# Patient Record
Sex: Male | Born: 1975
Health system: Southern US, Community
[De-identification: ages and names within clinical notes are randomized; demographics above are authoritative.]

## PROBLEM LIST (undated history)

## (undated) DIAGNOSIS — G4730 Sleep apnea, unspecified: Secondary | ICD-10-CM

## (undated) DIAGNOSIS — E119 Type 2 diabetes mellitus without complications: Secondary | ICD-10-CM

## (undated) DIAGNOSIS — J449 Chronic obstructive pulmonary disease, unspecified: Secondary | ICD-10-CM

## (undated) DIAGNOSIS — F17210 Nicotine dependence, cigarettes, uncomplicated: Secondary | ICD-10-CM

## (undated) DIAGNOSIS — I10 Essential (primary) hypertension: Secondary | ICD-10-CM

## (undated) DIAGNOSIS — E7800 Pure hypercholesterolemia, unspecified: Secondary | ICD-10-CM

---

## 2010-07-18 DIAGNOSIS — E119 Type 2 diabetes mellitus without complications: Secondary | ICD-10-CM

## 2010-07-18 HISTORY — DX: Type 2 diabetes mellitus without complications: E11.9

## 2014-05-06 DIAGNOSIS — K219 Gastro-esophageal reflux disease without esophagitis: Secondary | ICD-10-CM | POA: Insufficient documentation

## 2014-05-06 DIAGNOSIS — R0681 Apnea, not elsewhere classified: Secondary | ICD-10-CM

## 2014-05-06 DIAGNOSIS — G47419 Narcolepsy without cataplexy: Secondary | ICD-10-CM | POA: Insufficient documentation

## 2014-05-06 DIAGNOSIS — J309 Allergic rhinitis, unspecified: Secondary | ICD-10-CM | POA: Insufficient documentation

## 2014-10-23 DIAGNOSIS — G4733 Obstructive sleep apnea (adult) (pediatric): Secondary | ICD-10-CM | POA: Insufficient documentation

## 2014-12-29 ENCOUNTER — Encounter (HOSPITAL_COMMUNITY): Payer: Self-pay | Admitting: Emergency Medicine

## 2014-12-29 ENCOUNTER — Emergency Department (HOSPITAL_COMMUNITY)
Admission: EM | Admit: 2014-12-29 | Discharge: 2014-12-30 | Disposition: A | Discharge status: Home / Self Care | Payer: Self-pay | Attending: Emergency Medicine | Admitting: Emergency Medicine

## 2014-12-29 DIAGNOSIS — Z720 Tobacco use: Secondary | ICD-10-CM | POA: Insufficient documentation

## 2014-12-29 DIAGNOSIS — I10 Essential (primary) hypertension: Secondary | ICD-10-CM | POA: Insufficient documentation

## 2014-12-29 DIAGNOSIS — Z880 Allergy status to penicillin: Secondary | ICD-10-CM | POA: Insufficient documentation

## 2014-12-29 DIAGNOSIS — J449 Chronic obstructive pulmonary disease, unspecified: Secondary | ICD-10-CM | POA: Insufficient documentation

## 2014-12-29 DIAGNOSIS — E119 Type 2 diabetes mellitus without complications: Secondary | ICD-10-CM | POA: Insufficient documentation

## 2014-12-29 DIAGNOSIS — R252 Cramp and spasm: Secondary | ICD-10-CM | POA: Insufficient documentation

## 2014-12-29 DIAGNOSIS — Z8669 Personal history of other diseases of the nervous system and sense organs: Secondary | ICD-10-CM | POA: Insufficient documentation

## 2014-12-29 HISTORY — DX: Nicotine dependence, cigarettes, uncomplicated: F17.210

## 2014-12-29 HISTORY — DX: Type 2 diabetes mellitus without complications: E11.9

## 2014-12-29 HISTORY — DX: Essential (primary) hypertension: I10

## 2014-12-29 HISTORY — DX: Chronic obstructive pulmonary disease, unspecified: J44.9

## 2014-12-29 HISTORY — DX: Sleep apnea, unspecified: G47.30

## 2014-12-29 HISTORY — DX: Pure hypercholesterolemia, unspecified: E78.00

## 2014-12-29 NOTE — ED Notes (Signed)
Pt. reports pain at right lower calf onset 2 days ago , denies injury /ambulatory , respirations unlabored .

## 2014-12-29 NOTE — ED Provider Notes (Signed)
CSN: [id number]     Arrival date & time 12/29/14  2309 History  This chart was scribed for Everlene Farrier, PA-C, working with Tomasita Crumble, MD by Elon Spanner, ED Scribe. This patient was seen in room TR07C/TR07C and the patient's care was started at 11:52 PM.   Chief Complaint  Patient presents with  . Leg Pain   The history is provided by the patient. No language interpreter was used.   HPI Comments: Steven [name] is a 39 y.o. male with a history of DM who presents to the Emergency Department complaining of 6/10 right lower calf pain described as cramping onset 2 days ago.  He also reports intermittent tingling in the right toes for 1 week.  He reports increased exercise over the past several days when his pain began. He reports normally doing very little exercise.  ASA taken yesterday afforded no relief.  He denies recent surgery, prolonged immobilization, travel as well as a personal or family history of DVT/PE, or blood clotting disorders.  He takes metformin for his DM and his sugars have been relatively well-controlled recently - measured at 160 yesterday.  Patient is a current smoker.  He denies injury leg swelling, numbness, weakness, back pain, fever, chills, CP, SOB, palpitations, arthralgic popping or clicking sounds.    Past Medical History  Diagnosis Date  . COPD (chronic obstructive pulmonary disease)   . Diabetes mellitus without complication   . Hypertension   . Hypercholesterolemia   . Sleep apnea   . Cigarette smoker    History reviewed. No pertinent past surgical history. No family history on file. History  Substance Use Topics  . Smoking status: Current Every Day Smoker  . Smokeless tobacco: Not on file  . Alcohol Use: Yes    Review of Systems  Constitutional: Negative for fever and chills.  Respiratory: Negative for cough, chest tightness and shortness of breath.   Cardiovascular: Negative for chest pain and palpitations.  Musculoskeletal: Positive for  myalgias. Negative for back pain and neck pain.  Skin: Negative for rash and wound.  Neurological: Negative for weakness and numbness.      Allergies  Penicillins  Home Medications   Prior to Admission medications   Not on File   BP 133/86 mmHg  Pulse 80  Temp(Src) 98.1 F (36.7 C) (Oral)  Resp 20  Ht 6' (1.829 m)  Wt 285 lb 8 oz (129.502 kg)  BMI 38.71 kg/m2  SpO2 92% Physical Exam  Constitutional: He is oriented to person, place, and time. He appears well-developed and well-nourished. No distress.   Nontoxic appearing.  HENT:  Head: Normocephalic and atraumatic.  Eyes: Conjunctivae are normal. Pupils are equal, round, and reactive to light. Right eye exhibits no discharge. Left eye exhibits no discharge.  Neck: Normal range of motion. Neck supple. No JVD present.  Cardiovascular: Normal rate, regular rhythm, normal heart sounds and intact distal pulses.   Bilateral radial, posterior tibialis and dorsalis pedis pulses are intact.    Pulmonary/Chest: Effort normal and breath sounds normal. No respiratory distress. He has no wheezes. He has no rales.  Abdominal: Soft. There is no tenderness.  Musculoskeletal: Normal range of motion. He exhibits tenderness. He exhibits no edema.   There is no lower extremity edema or erythema bilaterally. The patient has mild tenderness to his right posterior lower leg beneath his gastrocnemius muscle. Negative thompson test. No achilles tendon rupture.  Patient has 5 out of 5 strength in his bilateral lower extremities. The patient  is able ambulate without difficulty or assistance.  Lymphadenopathy:    He has no cervical adenopathy.  Neurological: He is alert and oriented to person, place, and time. He has normal reflexes. He displays normal reflexes. Coordination normal.  Bilateral patellar DTR's intact. Sensation is intact in BLE.   Skin: Skin is warm and dry. No rash noted. He is not diaphoretic. No erythema. No pallor.  Psychiatric: He  has a normal mood and affect. His behavior is normal.  Nursing note and vitals reviewed.   ED Course  Procedures (including critical care time)  DIAGNOSTIC STUDIES: Oxygen Saturation is 92% on RA, low by my interpretation.    COORDINATION OF CARE:  11:00 PM Patient advised to f/u with [facility].  He was informed of return precautions and acknowledges and agrees with plan.    Labs Review Labs Reviewed  CBG MONITORING, ED - Abnormal; Notable for the following:    Glucose-Capillary 105 (*)    All other components within normal limits    Imaging Review No results found.   EKG Interpretation None      Filed Vitals:   12/29/14 2326 12/29/14 2352  BP: 133/86   Pulse: 80   Temp: 98.1 F (36.7 C)   TempSrc: Oral   Resp: 20   Height: 6' (1.829 m)   Weight: 280 lb (127.007 kg) 285 lb 8 oz (129.502 kg)  SpO2: 92%      MDM  Meds given in ED:  Medications - No data to display  There are no discharge medications for this patient.   Final diagnoses:  Cramps of right lower extremity    this is a 39 year old male with a history of diabetes who presents to the emergency department complaining of right lower leg pain for the past 2 days. Patient reports he is started exercising very recently and then began having pain to the posterior aspect of his right leg beneath his calf muscle. He reports his pain feels like a cramp. I think for treatment today. Reports his blood sugars have been controlled at home.  On exam the patient is afebrile and nontoxic appearing. There is no lower extremity edema or erythema. Patient has mild tenderness to the posterior aspect of his right lower leg beneath his gastrocnemius.  Negative Thompson's test. Patient's blood sugar was checked in the ER which was 105. I believe this patient's pain is due to his increase in exercise recently. Not concerned for DVT. He denies other cramping in the body. Education about exercising and stretching  provided. I advised the patient to follow-up with their primary care provider this week. I advised the patient to return to the emergency department with new or worsening symptoms or new concerns. The patient verbalized understanding and agreement with plan.    I personally performed the services described in this documentation, which was scribed in my presence. The recorded information has been reviewed and is accurate.     Everlene Farrier, PA-C 12/30/14 1610  Tomasita Crumble, MD 12/30/14 [phone]

## 2014-12-30 LAB — CBG MONITORING, ED: Glucose-Capillary: 105 mg/dL — ABNORMAL HIGH (ref 65–99)

## 2014-12-30 NOTE — Discharge Instructions (Signed)
Leg Cramps Leg cramps that occur during exercise can be caused by poor circulation or dehydration. However, muscle cramps that occur at rest or during the night are usually not due to any serious medical problem. Heat cramps may cause muscle spasms during hot weather.  CAUSES There is no clear cause for muscle cramps. However, dehydration may be a factor for those who do not drink enough fluids and those who exercise in the heat. Imbalances in the level of sodium, potassium, calcium or magnesium in the muscle tissue may also be a factor. Some medications, such as water pills (diuretics), may cause loss of chemicals that the body needs (like sodium and potassium) and cause muscle cramps. TREATMENT   Make sure your diet has enough fluids and essential minerals for the muscle to work normally.  Avoid strenuous exercise for several days if you have been having frequent leg cramps.  Stretch and massage the cramped muscle for several minutes.  Some medicines may be helpful in some patients with night cramps. Only take over-the-counter or prescription medicines as directed by your caregiver. SEEK IMMEDIATE MEDICAL CARE IF:   Your leg cramps become worse.  Your foot becomes cold, numb, or blue. Document Released: 08/11/2004 Document Revised: 09/26/2011 Document Reviewed: 07/29/2008 [facility] Patient Information 2015 [place], [place]. This information is not intended to replace advice given to you by your health care provider. Make sure you discuss any questions you have with your health care provider.  Muscle Cramps and Spasms Muscle cramps and spasms occur when a muscle or muscles tighten and you have no control over this tightening (involuntary muscle contraction). They are a common problem and can develop in any muscle. The most common place is in the calf muscles of the leg. Both muscle cramps and muscle spasms are involuntary muscle contractions, but they also have differences:   Muscle cramps are  sporadic and painful. They may last a few seconds to a quarter of an hour. Muscle cramps are often more forceful and last longer than muscle spasms.  Muscle spasms may or may not be painful. They may also last just a few seconds or much longer. CAUSES  It is uncommon for cramps or spasms to be due to a serious underlying problem. In many cases, the cause of cramps or spasms is unknown. Some common causes are:   Overexertion.   Overuse from repetitive motions (doing the same thing over and over).   Remaining in a certain position for a long period of time.   Improper preparation, form, or technique while performing a sport or activity.   Dehydration.   Injury.   Side effects of some medicines.   Abnormally low levels of the salts and ions in your blood (electrolytes), especially potassium and calcium. This could happen if you are taking water pills (diuretics) or you are pregnant.  Some underlying medical problems can make it more likely to develop cramps or spasms. These include, but are not limited to:   Diabetes.   Parkinson disease.   Hormone disorders, such as thyroid problems.   Alcohol abuse.   Diseases specific to muscles, joints, and bones.   Blood vessel disease where not enough blood is getting to the muscles.  HOME CARE INSTRUCTIONS   Stay well hydrated. Drink enough water and fluids to keep your urine clear or pale yellow.  It may be helpful to massage, stretch, and relax the affected muscle.  For tight or tense muscles, use a warm towel, heating pad, or hot  shower water directed to the affected area.  If you are sore or have pain after a cramp or spasm, applying ice to the affected area may relieve discomfort.  Put ice in a plastic bag.  Place a towel between your skin and the bag.  Leave the ice on for 15-20 minutes, 03-04 times a day.  Medicines used to treat a known cause of cramps or spasms may help reduce their frequency or severity.  Only take over-the-counter or prescription medicines as directed by your caregiver. SEEK MEDICAL CARE IF:  Your cramps or spasms get more severe, more frequent, or do not improve over time.  MAKE SURE YOU:   Understand these instructions.  Will watch your condition.  Will get help right away if you are not doing well or get worse. Document Released: 12/24/2001 Document Revised: 10/29/2012 Document Reviewed: 06/20/2012 [facility] Patient Information 2015 [place], [place]. This information is not intended to replace advice given to you by your health care provider. Make sure you discuss any questions you have with your health care provider.

## 2014-12-30 NOTE — ED Notes (Signed)
CBG 105 

## 2014-12-30 NOTE — ED Notes (Signed)
Pt verbalizes understanding of d/c instructions and denies any further needs at this time. 

## 2015-02-16 ENCOUNTER — Encounter (HOSPITAL_COMMUNITY): Payer: Self-pay | Admitting: *Deleted

## 2015-02-16 ENCOUNTER — Emergency Department (INDEPENDENT_AMBULATORY_CARE_PROVIDER_SITE_OTHER)
Admission: EM | Admit: 2015-02-16 | Discharge: 2015-02-16 | Disposition: A | Discharge status: Home / Self Care | Payer: Self-pay | Source: Home / Self Care | Attending: Family Medicine | Admitting: Family Medicine

## 2015-02-16 ENCOUNTER — Ambulatory Visit: Payer: Self-pay | Attending: Internal Medicine

## 2015-02-16 DIAGNOSIS — E669 Obesity, unspecified: Secondary | ICD-10-CM

## 2015-02-16 DIAGNOSIS — E119 Type 2 diabetes mellitus without complications: Secondary | ICD-10-CM

## 2015-02-16 DIAGNOSIS — E1169 Type 2 diabetes mellitus with other specified complication: Secondary | ICD-10-CM

## 2015-02-16 MED ORDER — OMEPRAZOLE 40 MG PO CPDR: 40.0000 mg | DELAYED_RELEASE_CAPSULE | Freq: Every day | ORAL | Status: DC

## 2015-02-16 MED ORDER — ALBUTEROL SULFATE HFA 108 (90 BASE) MCG/ACT IN AERS: 2.0000 | INHALATION_SPRAY | Freq: Four times a day (QID) | RESPIRATORY_TRACT | Status: AC | PRN

## 2015-02-16 MED ORDER — LOSARTAN POTASSIUM 50 MG PO TABS
50.0000 mg | ORAL_TABLET | Freq: Every day | ORAL | Status: DC
Start: 2015-02-16 — End: 2015-04-22

## 2015-02-16 NOTE — Discharge Instructions (Signed)
See your doctor as planned. °

## 2015-02-16 NOTE — ED Notes (Signed)
Pt  Reports  He  Recently  Moved  To  Area   He  Has  No   Provider     He  Is  Out of his  meds  And  Needs  Refill       For  His  Blood pressure    meds       And diabetic  meds     He  Is  Trying to  Get  His  Care  Lined  At  The  [facility]

## 2015-02-16 NOTE — ED Provider Notes (Signed)
CSN: [id number]     Arrival date & time 02/16/15  1522 History   First MD Initiated Contact with Patient 02/16/15 1705     Chief Complaint  Patient presents with  . Medication Refill   (Consider location/radiation/quality/duration/timing/severity/associated sxs/prior Treatment) Patient is a 39 y.o. male presenting with heartburn. The history is provided by the patient.  Heartburn This is a chronic problem. Episode onset: out of meds for11mo. The problem has not changed since onset.Pertinent negatives include no chest pain and no abdominal pain.    Past Medical History  Diagnosis Date  . COPD (chronic obstructive pulmonary disease)   . Diabetes mellitus without complication   . Hypertension   . Hypercholesterolemia   . Sleep apnea   . Cigarette smoker    History reviewed. No pertinent past surgical history. History reviewed. No pertinent family history. History  Substance Use Topics  . Smoking status: Current Every Day Smoker  . Smokeless tobacco: Not on file  . Alcohol Use: Yes    Review of Systems  Constitutional: Negative.   Respiratory: Negative.   Cardiovascular: Negative.  Negative for chest pain.  Gastrointestinal: Positive for heartburn. Negative for nausea, vomiting and abdominal pain.    Allergies  Penicillins  Home Medications   Prior to Admission medications   Medication Sig Start Date End Date Taking? Authorizing Provider  amLODipine (NORVASC) 5 MG tablet Take 5 mg by mouth daily.   Yes Historical Provider, MD  furosemide (LASIX) 40 MG tablet Take 40 mg by mouth daily.   Yes Historical Provider, MD  rosuvastatin (CRESTOR) 40 MG tablet Take 40 mg by mouth daily.   Yes Historical Provider, MD  sitaGLIPtin-metformin (JANUMET) 50-1000 MG per tablet Take 1 tablet by mouth 2 (two) times daily with a meal.   Yes Historical Provider, MD  albuterol (PROVENTIL HFA;VENTOLIN HFA) 108 (90 BASE) MCG/ACT inhaler Inhale 2 puffs into the lungs every 6 (six) hours as needed  for wheezing or shortness of breath. 02/16/15   Linna Hoff, MD  losartan (COZAAR) 50 MG tablet Take 1 tablet (50 mg total) by mouth daily. 02/16/15   Linna Hoff, MD  omeprazole (PRILOSEC) 40 MG capsule Take 1 capsule (40 mg total) by mouth daily. 02/16/15   Linna Hoff, MD   BP 101/64 mmHg  Pulse 64  Temp(Src) 98.1 F (36.7 C) (Oral)  Resp 16  SpO2 97% Physical Exam  Constitutional: He is oriented to person, place, and time. He appears well-developed and well-nourished.  Cardiovascular: Normal heart sounds.   Pulmonary/Chest: Breath sounds normal.  Abdominal: Soft. Bowel sounds are normal.  Neurological: He is alert and oriented to person, place, and time.  Skin: Skin is warm and dry.  Nursing note and vitals reviewed.   ED Course  Procedures (including critical care time) Labs Review Labs Reviewed - No data to display  Imaging Review No results found.   MDM   1. Diabetes mellitus type 2 in obese        Linna Hoff, MD 02/16/15 1810

## 2015-03-01 ENCOUNTER — Emergency Department (HOSPITAL_COMMUNITY)
Admission: EM | Admit: 2015-03-01 | Discharge: 2015-03-01 | Disposition: A | Discharge status: Home / Self Care | Payer: Self-pay | Attending: Emergency Medicine | Admitting: Emergency Medicine

## 2015-03-01 ENCOUNTER — Emergency Department (HOSPITAL_COMMUNITY): Payer: Self-pay

## 2015-03-01 ENCOUNTER — Encounter (HOSPITAL_COMMUNITY): Payer: Self-pay | Admitting: *Deleted

## 2015-03-01 DIAGNOSIS — R0789 Other chest pain: Secondary | ICD-10-CM | POA: Insufficient documentation

## 2015-03-01 DIAGNOSIS — E119 Type 2 diabetes mellitus without complications: Secondary | ICD-10-CM | POA: Insufficient documentation

## 2015-03-01 DIAGNOSIS — E780 Pure hypercholesterolemia: Secondary | ICD-10-CM | POA: Insufficient documentation

## 2015-03-01 DIAGNOSIS — I10 Essential (primary) hypertension: Secondary | ICD-10-CM | POA: Insufficient documentation

## 2015-03-01 DIAGNOSIS — Z720 Tobacco use: Secondary | ICD-10-CM | POA: Insufficient documentation

## 2015-03-01 DIAGNOSIS — Z880 Allergy status to penicillin: Secondary | ICD-10-CM | POA: Insufficient documentation

## 2015-03-01 DIAGNOSIS — J449 Chronic obstructive pulmonary disease, unspecified: Secondary | ICD-10-CM | POA: Insufficient documentation

## 2015-03-01 DIAGNOSIS — Z79899 Other long term (current) drug therapy: Secondary | ICD-10-CM | POA: Insufficient documentation

## 2015-03-01 DIAGNOSIS — Z8669 Personal history of other diseases of the nervous system and sense organs: Secondary | ICD-10-CM | POA: Insufficient documentation

## 2015-03-01 LAB — CBC
HEMATOCRIT: 44.4 % (ref 39.0–52.0)
Hemoglobin: 14.6 g/dL (ref 13.0–17.0)
MCH: 28.7 pg (ref 26.0–34.0)
MCHC: 32.9 g/dL (ref 30.0–36.0)
MCV: 87.2 fL (ref 78.0–100.0)
PLATELETS: 189 10*3/uL (ref 150–400)
RBC: 5.09 MIL/uL (ref 4.22–5.81)
RDW: 14.6 % (ref 11.5–15.5)
WBC: 6.9 10*3/uL (ref 4.0–10.5)

## 2015-03-01 LAB — I-STAT TROPONIN, ED: Troponin i, poc: 0 ng/mL (ref 0.00–0.08)

## 2015-03-01 LAB — BASIC METABOLIC PANEL
ANION GAP: 8 (ref 5–15)
BUN: 9 mg/dL (ref 6–20)
CO2: 24 mmol/L (ref 22–32)
Calcium: 9.2 mg/dL (ref 8.9–10.3)
Chloride: 106 mmol/L (ref 101–111)
Creatinine, Ser: 1.07 mg/dL (ref 0.61–1.24)
GFR calc Af Amer: >60 mL/min (ref >60)
GFR calc non Af Amer: >60 mL/min (ref >60)
GLUCOSE: 100 mg/dL — AB (ref 65–99)
POTASSIUM: 4.1 mmol/L (ref 3.5–5.1)
Sodium: 138 mmol/L (ref 135–145)

## 2015-03-01 NOTE — ED Provider Notes (Signed)
CSN: [id number]     Arrival date & time 03/01/15  1150 History   First MD Initiated Contact with Patient 03/01/15 1443     Chief Complaint  Patient presents with  . Chest Pain   HPI   39 year old male presents today with right anterior chest wall pain. Patient reports proximally 3-4 days ago he was lifting a heavy trash can with his right arm and felt a sharp pain in his right anterior chest. He notes since that time he's had sharp pain with movements of his right arm and deep breaths. Patient reports he tried taking Aleve once this did not improve his symptoms. Patient denies any prior history of trauma to the chest. He denies any loss of sensation strength or function in the right extremity. Patient reports he smokes, no history DVT, PE, denies shortness of breath, history of malignancy. No recent prolonged immobilization, trauma,  or surgeries.  Past Medical History  Diagnosis Date  . COPD (chronic obstructive pulmonary disease)   . Diabetes mellitus without complication   . Hypertension   . Hypercholesterolemia   . Sleep apnea   . Cigarette smoker    History reviewed. No pertinent past surgical history. History reviewed. No pertinent family history. Social History  Substance Use Topics  . Smoking status: Current Every Day Smoker  . Smokeless tobacco: None  . Alcohol Use: Yes    Review of Systems  All other systems reviewed and are negative.   Allergies  Penicillins  Home Medications   Prior to Admission medications   Medication Sig Start Date End Date Taking? Authorizing Provider  albuterol (PROVENTIL HFA;VENTOLIN HFA) 108 (90 BASE) MCG/ACT inhaler Inhale 2 puffs into the lungs every 6 (six) hours as needed for wheezing or shortness of breath. 02/16/15   Linna Hoff, MD  amLODipine (NORVASC) 5 MG tablet Take 5 mg by mouth daily.    Historical Provider, MD  furosemide (LASIX) 40 MG tablet Take 40 mg by mouth daily.    Historical Provider, MD  losartan (COZAAR) 50 MG  tablet Take 1 tablet (50 mg total) by mouth daily. 02/16/15   Linna Hoff, MD  omeprazole (PRILOSEC) 40 MG capsule Take 1 capsule (40 mg total) by mouth daily. 02/16/15   Linna Hoff, MD  rosuvastatin (CRESTOR) 40 MG tablet Take 40 mg by mouth daily.    Historical Provider, MD  sitaGLIPtin-metformin (JANUMET) 50-1000 MG per tablet Take 1 tablet by mouth 2 (two) times daily with a meal.    Historical Provider, MD   BP 143/98 mmHg  Pulse 57  Temp(Src) 98.1 F (36.7 C) (Oral)  Resp 18  Ht  (1.854 m)  Wt 280 lb (127.007 kg)  BMI 36.95 kg/m2  SpO2 95%   Physical Exam  Constitutional: He is oriented to person, place, and time. He appears well-developed and well-nourished.  HENT:  Head: Normocephalic and atraumatic.  Eyes: Conjunctivae are normal. Pupils are equal, round, and reactive to light. Right eye exhibits no discharge. Left eye exhibits no discharge. No scleral icterus.  Neck: Normal range of motion. No JVD present. No tracheal deviation present.  Cardiovascular: Normal rate, regular rhythm, normal heart sounds and intact distal pulses.  Exam reveals no gallop and no friction rub.   No murmur heard. Pulmonary/Chest: Effort normal and breath sounds normal. No stridor. No respiratory distress. He has no wheezes. He has no rales. He exhibits no tenderness.  Musculoskeletal: He exhibits no edema or tenderness.  Tenderness to palpation of  right anterior chest wall, no signs of trauma, obvious deformities. No signs of infection.  Neurological: He is alert and oriented to person, place, and time. Coordination normal.  Skin: Skin is warm and dry.  Psychiatric: He has a normal mood and affect. His behavior is normal. Judgment and thought content normal.  Nursing note and vitals reviewed.   ED Course  Procedures (including critical care time) Labs Review Labs Reviewed  BASIC METABOLIC PANEL - Abnormal; Notable for the following:    Glucose, Bld 100 (*)    All other components within  normal limits  CBC  I-STAT TROPOININ, ED    Imaging Review Dg Chest 2 View  03/01/2015   CLINICAL DATA:  39 year old male with history of right-sided chest pain for the past 4 days.  EXAM: CHEST  2 VIEW  COMPARISON:  No priors.  FINDINGS: Azygos lobe (normal anatomical variant) incidentally noted. Lung volumes are normal. No consolidative airspace disease. No pleural effusions. No pneumothorax. No pulmonary nodule or mass noted. Pulmonary vasculature and the cardiomediastinal silhouette are within normal limits.  IMPRESSION: No radiographic evidence of acute cardiopulmonary disease.   Electronically Signed   By: Trudie Reed M.D.   On: 03/01/2015 13:29   I, Estrellita Lasky Todd, personally reviewed and evaluated these images and lab results as part of my medical decision-making.   EKG Interpretation None      MDM   Final diagnoses:  Chest wall pain    Labs: I-STAT troponin, BMP, CBC - no significant findings  Imaging: DG chest 2 view - no significant findings  Consults:   Therapeutics:  Discharge Meds:   Assessment/Plan: 39 year old male with right anterior chest wall pain. Pain started after a specific event, has continued. Patient stated to palpation of the anterior chest wall, per negative. Today's presentation likely represents muscular pain. Patient has no symptoms or deficits in his right extremity or surrounding tissue. Patient instructed to use ibuprofen 600 mg for the next 5 days, followed with primary care evaluation thereafter. Given strict return precautions, verbalized understanding and agreed to today's plan no further questions or concerns at time of discharge.        Eyvonne Mechanic, PA-C 03/01/15 1516  Eyvonne Mechanic, PA-C 03/01/15 1539  Margarita Grizzle, MD 03/01/15 [phone]

## 2015-03-01 NOTE — ED Notes (Signed)
Pt returns from xray

## 2015-03-01 NOTE — ED Notes (Signed)
Pt reports right side chest pain x 4 days. Pain increases with movement, cough, breathing. Reports swelling to right breast area and palpates a "knot" there. No acute distress noted at triage.

## 2015-03-01 NOTE — Discharge Instructions (Signed)
Chest Wall Pain Chest wall pain is pain in or around the bones and muscles of your chest. It may take up to 6 weeks to get better. It may take longer if you must stay physically active in your work and activities.  CAUSES  Chest wall pain may happen on its own. However, it may be caused by:  A viral illness like the flu.  Injury.  Coughing.  Exercise.  Arthritis.  Fibromyalgia.  Shingles. HOME CARE INSTRUCTIONS   Avoid overtiring physical activity. Try not to strain or perform activities that cause pain. This includes any activities using your chest or your abdominal and side muscles, especially if heavy weights are used.  Put ice on the sore area.  Put ice in a plastic bag.  Place a towel between your skin and the bag.  Leave the ice on for 15-20 minutes per hour while awake for the first 2 days.  Only take over-the-counter or prescription medicines for pain, discomfort, or fever as directed by your caregiver. SEEK IMMEDIATE MEDICAL CARE IF:   Your pain increases, or you are very uncomfortable.  You have a fever.  Your chest pain becomes worse.  You have new, unexplained symptoms.  You have nausea or vomiting.  You feel sweaty or lightheaded.  You have a cough with phlegm (sputum), or you cough up blood. MAKE SURE YOU:   Understand these instructions.  Will watch your condition.  Will get help right away if you are not doing well or get worse. Document Released: 07/04/2005 Document Revised: 09/26/2011 Document Reviewed: 02/28/2011 [facility] Patient Information 2015 [place], [place]. This information is not intended to replace advice given to you by your health care provider. Make sure you discuss any questions you have with your health care provider.  Please use ibuprofen as needed for pain., Follow-up with [facility] wellness for further evaluation and management.

## 2015-04-03 ENCOUNTER — Emergency Department (HOSPITAL_COMMUNITY)
Admission: EM | Admit: 2015-04-03 | Discharge: 2015-04-03 | Disposition: A | Discharge status: Home / Self Care | Payer: Self-pay | Attending: Emergency Medicine | Admitting: Emergency Medicine

## 2015-04-03 ENCOUNTER — Encounter (HOSPITAL_COMMUNITY): Payer: Self-pay | Admitting: Emergency Medicine

## 2015-04-03 ENCOUNTER — Emergency Department (HOSPITAL_COMMUNITY): Payer: Self-pay

## 2015-04-03 DIAGNOSIS — E119 Type 2 diabetes mellitus without complications: Secondary | ICD-10-CM | POA: Insufficient documentation

## 2015-04-03 DIAGNOSIS — Y998 Other external cause status: Secondary | ICD-10-CM | POA: Insufficient documentation

## 2015-04-03 DIAGNOSIS — E780 Pure hypercholesterolemia: Secondary | ICD-10-CM | POA: Insufficient documentation

## 2015-04-03 DIAGNOSIS — X58XXXA Exposure to other specified factors, initial encounter: Secondary | ICD-10-CM | POA: Insufficient documentation

## 2015-04-03 DIAGNOSIS — J449 Chronic obstructive pulmonary disease, unspecified: Secondary | ICD-10-CM | POA: Insufficient documentation

## 2015-04-03 DIAGNOSIS — Z880 Allergy status to penicillin: Secondary | ICD-10-CM | POA: Insufficient documentation

## 2015-04-03 DIAGNOSIS — S93601A Unspecified sprain of right foot, initial encounter: Secondary | ICD-10-CM | POA: Insufficient documentation

## 2015-04-03 DIAGNOSIS — Y9289 Other specified places as the place of occurrence of the external cause: Secondary | ICD-10-CM | POA: Insufficient documentation

## 2015-04-03 DIAGNOSIS — Y9389 Activity, other specified: Secondary | ICD-10-CM | POA: Insufficient documentation

## 2015-04-03 DIAGNOSIS — Z79899 Other long term (current) drug therapy: Secondary | ICD-10-CM | POA: Insufficient documentation

## 2015-04-03 DIAGNOSIS — I10 Essential (primary) hypertension: Secondary | ICD-10-CM | POA: Insufficient documentation

## 2015-04-03 DIAGNOSIS — Z720 Tobacco use: Secondary | ICD-10-CM | POA: Insufficient documentation

## 2015-04-03 MED ORDER — NAPROXEN 500 MG PO TABS: 500.0000 mg | ORAL_TABLET | Freq: Two times a day (BID) | ORAL | Status: DC

## 2015-04-03 MED ORDER — HYDROCODONE-ACETAMINOPHEN 5-325 MG PO TABS
2.0000 | ORAL_TABLET | Freq: Once | ORAL | Status: AC
  Administered 2015-04-03: 2 via ORAL
  Filled 2015-04-03: qty 2

## 2015-04-03 NOTE — ED Provider Notes (Signed)
CSN: [id number]     Arrival date & time 04/03/15  0220 History   First MD Initiated Contact with Patient 04/03/15 0232     Chief Complaint  Patient presents with  . Ankle Pain     (Consider location/radiation/quality/duration/timing/severity/associated sxs/prior Treatment) Patient is a 39 y.o. male presenting with ankle pain. The history is provided by the patient. No language interpreter was used.  Ankle Pain Location:  Foot Time since incident:  2 days Injury: no   Foot location:  R foot Pain details:    Quality:  Sharp and aching   Radiates to:  Does not radiate   Severity:  Moderate   Onset quality:  Gradual   Duration:  2 days   Timing:  Constant   Progression:  Worsening Chronicity:  New Prior injury to area:  No Relieved by:  Nothing Worsened by:  Bearing weight Ineffective treatments: aspirin. Associated symptoms: no decreased ROM, no fever, no numbness, no swelling and no tingling   Risk factors: obesity     Past Medical History  Diagnosis Date  . COPD (chronic obstructive pulmonary disease)   . Diabetes mellitus without complication   . Hypertension   . Hypercholesterolemia   . Sleep apnea   . Cigarette smoker    History reviewed. No pertinent past surgical history. No family history on file. Social History  Substance Use Topics  . Smoking status: Current Every Day Smoker  . Smokeless tobacco: None  . Alcohol Use: Yes    Review of Systems  Constitutional: Negative for fever.  Musculoskeletal: Positive for myalgias and arthralgias.  Neurological: Negative for weakness and numbness.  All other systems reviewed and are negative.   Allergies  Penicillins  Home Medications   Prior to Admission medications   Medication Sig Start Date End Date Taking? Authorizing Provider  albuterol (PROVENTIL HFA;VENTOLIN HFA) 108 (90 BASE) MCG/ACT inhaler Inhale 2 puffs into the lungs every 6 (six) hours as needed for wheezing or shortness of breath. 02/16/15    Linna Hoff, MD  amLODipine (NORVASC) 5 MG tablet Take 5 mg by mouth daily.    Historical Provider, MD  furosemide (LASIX) 40 MG tablet Take 40 mg by mouth daily.    Historical Provider, MD  losartan (COZAAR) 50 MG tablet Take 1 tablet (50 mg total) by mouth daily. 02/16/15   Linna Hoff, MD  naproxen (NAPROSYN) 500 MG tablet Take 1 tablet (500 mg total) by mouth 2 (two) times daily. 04/03/15   Antony Madura, PA-C  omeprazole (PRILOSEC) 40 MG capsule Take 1 capsule (40 mg total) by mouth daily. 02/16/15   Linna Hoff, MD  rosuvastatin (CRESTOR) 40 MG tablet Take 40 mg by mouth daily.    Historical Provider, MD  sitaGLIPtin-metformin (JANUMET) 50-1000 MG per tablet Take 1 tablet by mouth 2 (two) times daily with a meal.    Historical Provider, MD   BP 140/69 mmHg  Pulse 67  Temp(Src) 98.3 F (36.8 C) (Oral)  Resp 16  Ht 6' (1.829 m)  SpO2 96%   Physical Exam  Constitutional: He is oriented to person, place, and time. He appears well-developed and well-nourished. No distress.  Nontoxic/nonseptic appearing  HENT:  Head: Normocephalic and atraumatic.  Eyes: Conjunctivae and EOM are normal. No scleral icterus.  Neck: Normal range of motion.  Cardiovascular: Normal rate, regular rhythm and intact distal pulses.   DP and PT pulses 2+ in the RLE  Pulmonary/Chest: Effort normal. No respiratory distress.  Respirations even and  unlabored  Musculoskeletal: Normal range of motion.       Right ankle: Normal.       Right foot: There is tenderness and bony tenderness. There is normal range of motion, no swelling, normal capillary refill, no crepitus and no deformity.       Feet:  Neurological: He is alert and oriented to person, place, and time. He exhibits normal muscle tone. Coordination normal.  Sensation to light touch intact. Patient able to wiggle all toes.  Skin: Skin is warm and dry. No rash noted. He is not diaphoretic. No erythema. No pallor.  Psychiatric: He has a normal mood and  affect. His behavior is normal.  Nursing note and vitals reviewed.   ED Course  Procedures (including critical care time) Labs Review Labs Reviewed - No data to display  Imaging Review Dg Foot Complete Right  04/03/2015   CLINICAL DATA:  39 year old male with right foot pain.  EXAM: RIGHT FOOT COMPLETE - 3+ VIEW  COMPARISON:  None.  FINDINGS: There is no evidence of fracture or dislocation. There is no evidence of arthropathy or other focal bone abnormality. Soft tissues are unremarkable.  IMPRESSION: Negative.   Electronically Signed   By: Elgie Collard M.D.   On: 04/03/2015 03:33     I have personally reviewed and evaluated these images and lab results as part of my medical decision-making.   EKG Interpretation None      MDM   Final diagnoses:  Foot sprain, right, initial encounter    39 y/o male presents for atraumatic R foot pain, onset yesterday. Patient neurovascularly intact. No redness, swelling, or heat to touch. Normal ROM. Doubt septic joint or hemarthrosis. No bony changes on xray. Will tx symptoms as foot sprain. ASO given for stability of foot/ankle. RICE and NSAIDs advised. Patient referred to podiatry if pain persists. Return precautions given. Patient agreeable to plan with no unaddressed concerns. Patient discharged in good condition.   Filed Vitals:   04/03/15 0222 04/03/15 0346 04/03/15 0346  BP: 144/72 140/69   Pulse: 88 67   Temp: 98.3 F (36.8 C)    TempSrc: Oral    Resp: 14 16   Height:   6' (1.829 m)  SpO2: 96% 96%      Antony Madura, PA-C 04/03/15 1610  Loren Racer, MD 04/03/15 [phone]

## 2015-04-03 NOTE — Discharge Instructions (Signed)
Foot Sprain The muscles and cord like structures which attach muscle to bone (tendons) that surround the feet are made up of units. A foot sprain can occur at the weakest spot in any of these units. This condition is most often caused by injury to or overuse of the foot, as from playing contact sports, or aggravating a previous injury, or from poor conditioning, or obesity. SYMPTOMS  Pain with movement of the foot.  Tenderness and swelling at the injury site.  Loss of strength is present in moderate or severe sprains. THE THREE GRADES OR SEVERITY OF FOOT SPRAIN ARE:  Mild (Grade I): Slightly pulled muscle without tearing of muscle or tendon fibers or loss of strength.  Moderate (Grade II): Tearing of fibers in a muscle, tendon, or at the attachment to bone, with small decrease in strength.  Severe (Grade III): Rupture of the muscle-tendon-bone attachment, with separation of fibers. Severe sprain requires surgical repair. Often repeating (chronic) sprains are caused by overuse. Sudden (acute) sprains are caused by direct injury or over-use. DIAGNOSIS  Diagnosis of this condition is usually by your own observation. If problems continue, a caregiver may be required for further evaluation and treatment. X-rays may be required to make sure there are not breaks in the bones (fractures) present. Continued problems may require physical therapy for treatment. PREVENTION  Use strength and conditioning exercises appropriate for your sport.  Warm up properly prior to working out.  Use athletic shoes that are made for the sport you are participating in.  Allow adequate time for healing. Early return to activities makes repeat injury more likely, and can lead to an unstable arthritic foot that can result in prolonged disability. Mild sprains generally heal in 3 to 10 days, with moderate and severe sprains taking 2 to 10 weeks. Your caregiver can help you determine the proper time required for  healing. HOME CARE INSTRUCTIONS   Apply ice to the injury for 15-20 minutes, 03-04 times per day. Put the ice in a plastic bag and place a towel between the bag of ice and your skin.  An elastic wrap (like an Ace bandage) may be used to keep swelling down.  Keep foot above the level of the heart, or at least raised on a footstool, when swelling and pain are present.  Try to avoid use other than gentle range of motion while the foot is painful. Do not resume use until instructed by your caregiver. Then begin use gradually, not increasing use to the point of pain. If pain does develop, decrease use and continue the above measures, gradually increasing activities that do not cause discomfort, until you gradually achieve normal use.  Use crutches if and as instructed, and for the length of time instructed.  Keep injured foot and ankle wrapped between treatments.  Massage foot and ankle for comfort and to keep swelling down. Massage from the toes up towards the knee.  Only take over-the-counter or prescription medicines for pain, discomfort, or fever as directed by your caregiver. SEEK IMMEDIATE MEDICAL CARE IF:   Your pain and swelling increase, or pain is not controlled with medications.  You have loss of feeling in your foot or your foot turns cold or blue.  You develop new, unexplained symptoms, or an increase of the symptoms that brought you to your caregiver. MAKE SURE YOU:   Understand these instructions.  Will watch your condition.  Will get help right away if you are not doing well or get worse. Document Released:   12/24/2001 Document Revised: 09/26/2011 Document Reviewed: 02/21/2008 ExitCare Patient Information 2015 [employer]. This information is not intended to replace advice given to you by your health care provider. Make sure you discuss any questions you have with your health care provider.  RICE: Routine Care for Injuries The routine care of many injuries includes  Rest, Ice, Compression, and Elevation (RICE). HOME CARE INSTRUCTIONS  Rest is needed to allow your body to heal. Routine activities can usually be resumed when comfortable. Injured tendons and bones can take up to 6 weeks to heal. Tendons are the cord-like structures that attach muscle to bone.  Ice following an injury helps keep the swelling down and reduces pain.  Put ice in a plastic bag.  Place a towel between your skin and the bag.  Leave the ice on for 15-20 minutes, 3-4 times a day, or as directed by your health care provider. Do this while awake, for the first 24 to 48 hours. After that, continue as directed by your caregiver.  Compression helps keep swelling down. It also gives support and helps with discomfort. If an elastic bandage has been applied, it should be removed and reapplied every 3 to 4 hours. It should not be applied tightly, but firmly enough to keep swelling down. Watch fingers or toes for swelling, bluish discoloration, coldness, numbness, or excessive pain. If any of these problems occur, remove the bandage and reapply loosely. Contact your caregiver if these problems continue.  Elevation helps reduce swelling and decreases pain. With extremities, such as the arms, hands, legs, and feet, the injured area should be placed near or above the level of the heart, if possible. SEEK IMMEDIATE MEDICAL CARE IF:  You have persistent pain and swelling.  You develop redness, numbness, or unexpected weakness.  Your symptoms are getting worse rather than improving after several days. These symptoms may indicate that further evaluation or further X-rays are needed. Sometimes, X-rays may not show a small broken bone (fracture) until 1 week or 10 days later. Make a follow-up appointment with your caregiver. Ask when your X-ray results will be ready. Make sure you get your X-ray results. Document Released: 10/16/2000 Document Revised: 07/09/2013 Document Reviewed:  12/03/2010 ExitCare Patient Information 2015 [employer]. This information is not intended to replace advice given to you by your health care provider. Make sure you discuss any questions you have with your health care provider.  

## 2015-04-03 NOTE — ED Notes (Signed)
Ortho tech paged  

## 2015-04-03 NOTE — ED Notes (Signed)
Patient is resting comfortably. 

## 2015-04-03 NOTE — ED Notes (Signed)
Pt. reports right ankle and right foot pain onset yesterday , denies injury , ambulatory .

## 2015-04-03 NOTE — ED Notes (Signed)
Kelly, pa-c, at the bedside 

## 2015-04-20 ENCOUNTER — Telehealth: Payer: Self-pay | Admitting: Internal Medicine

## 2015-04-20 ENCOUNTER — Ambulatory Visit: Payer: Self-pay | Admitting: Internal Medicine

## 2015-04-20 NOTE — Telephone Encounter (Signed)
Patient called requesting a med refill on sitaGLIPtin-metformin (JANUMET) 50-1000 MG per tablet, and losartan (COZAAR) 50 MG tablet. Patient does not have a PCP. Please f/u with pt.

## 2015-04-22 ENCOUNTER — Ambulatory Visit: Payer: Self-pay | Attending: Internal Medicine | Admitting: Family Medicine

## 2015-04-22 ENCOUNTER — Encounter: Payer: Self-pay | Admitting: Family Medicine

## 2015-04-22 VITALS — BP 133/84 | HR 73 | Temp 98.1°F | Resp 16 | Ht 73.0 in | Wt 288.0 lb

## 2015-04-22 DIAGNOSIS — B351 Tinea unguium: Secondary | ICD-10-CM | POA: Insufficient documentation

## 2015-04-22 DIAGNOSIS — E7800 Pure hypercholesterolemia, unspecified: Secondary | ICD-10-CM | POA: Insufficient documentation

## 2015-04-22 DIAGNOSIS — Z833 Family history of diabetes mellitus: Secondary | ICD-10-CM | POA: Insufficient documentation

## 2015-04-22 DIAGNOSIS — R809 Proteinuria, unspecified: Secondary | ICD-10-CM

## 2015-04-22 DIAGNOSIS — Z0000 Encounter for general adult medical examination without abnormal findings: Secondary | ICD-10-CM

## 2015-04-22 DIAGNOSIS — E119 Type 2 diabetes mellitus without complications: Secondary | ICD-10-CM | POA: Insufficient documentation

## 2015-04-22 DIAGNOSIS — F17210 Nicotine dependence, cigarettes, uncomplicated: Secondary | ICD-10-CM | POA: Insufficient documentation

## 2015-04-22 DIAGNOSIS — Z720 Tobacco use: Secondary | ICD-10-CM

## 2015-04-22 DIAGNOSIS — E049 Nontoxic goiter, unspecified: Secondary | ICD-10-CM | POA: Insufficient documentation

## 2015-04-22 DIAGNOSIS — E118 Type 2 diabetes mellitus with unspecified complications: Secondary | ICD-10-CM

## 2015-04-22 DIAGNOSIS — E1129 Type 2 diabetes mellitus with other diabetic kidney complication: Secondary | ICD-10-CM

## 2015-04-22 DIAGNOSIS — R0602 Shortness of breath: Secondary | ICD-10-CM | POA: Insufficient documentation

## 2015-04-22 DIAGNOSIS — Z79899 Other long term (current) drug therapy: Secondary | ICD-10-CM | POA: Insufficient documentation

## 2015-04-22 DIAGNOSIS — G4730 Sleep apnea, unspecified: Secondary | ICD-10-CM | POA: Insufficient documentation

## 2015-04-22 DIAGNOSIS — E1169 Type 2 diabetes mellitus with other specified complication: Secondary | ICD-10-CM | POA: Insufficient documentation

## 2015-04-22 DIAGNOSIS — F17200 Nicotine dependence, unspecified, uncomplicated: Secondary | ICD-10-CM | POA: Insufficient documentation

## 2015-04-22 DIAGNOSIS — I10 Essential (primary) hypertension: Secondary | ICD-10-CM | POA: Insufficient documentation

## 2015-04-22 DIAGNOSIS — J449 Chronic obstructive pulmonary disease, unspecified: Secondary | ICD-10-CM | POA: Insufficient documentation

## 2015-04-22 LAB — T3, FREE: T3, Free: 3.6 pg/mL (ref 2.3–4.2)

## 2015-04-22 LAB — COMPLETE METABOLIC PANEL WITH GFR
ALT: 27 U/L (ref 9–46)
AST: 20 U/L (ref 10–40)
Albumin: 4.6 g/dL (ref 3.6–5.1)
Alkaline Phosphatase: 68 U/L (ref 40–115)
BUN: 12 mg/dL (ref 7–25)
CALCIUM: 9.5 mg/dL (ref 8.6–10.3)
CO2: 28 mmol/L (ref 20–31)
Chloride: 107 mmol/L (ref 98–110)
Creat: 1.23 mg/dL (ref 0.60–1.35)
GFR, Est African American: 84 mL/min (ref >=60)
GFR, Est Non African American: 73 mL/min (ref >=60)
Glucose, Bld: 95 mg/dL (ref 65–99)
Potassium: 5 mmol/L (ref 3.5–5.3)
Sodium: 143 mmol/L (ref 135–146)
Total Bilirubin: 0.3 mg/dL (ref 0.2–1.2)
Total Protein: 7.6 g/dL (ref 6.1–8.1)

## 2015-04-22 LAB — GLUCOSE, POCT (MANUAL RESULT ENTRY): POC GLUCOSE: 107 mg/dL — AB (ref 70–99)

## 2015-04-22 LAB — POCT GLYCOSYLATED HEMOGLOBIN (HGB A1C): HEMOGLOBIN A1C: 6.3

## 2015-04-22 LAB — T4, FREE: Free T4: 1.02 ng/dL (ref 0.80–1.80)

## 2015-04-22 LAB — TSH: TSH: 0.527 u[IU]/mL (ref 0.350–4.500)

## 2015-04-22 MED ORDER — SITAGLIPTIN PHOS-METFORMIN HCL 50-1000 MG PO TABS: 1.0000 | ORAL_TABLET | Freq: Two times a day (BID) | ORAL | Status: DC

## 2015-04-22 MED ORDER — VARENICLINE TARTRATE 1 MG PO TABS: 1.0000 mg | ORAL_TABLET | Freq: Two times a day (BID) | ORAL | Status: DC

## 2015-04-22 MED ORDER — LOSARTAN POTASSIUM 50 MG PO TABS: 50.0000 mg | ORAL_TABLET | Freq: Every day | ORAL | Status: DC

## 2015-04-22 MED ORDER — CHLORTHALIDONE 25 MG PO TABS: 25.0000 mg | ORAL_TABLET | Freq: Every day | ORAL | Status: DC

## 2015-04-22 MED ORDER — VARENICLINE TARTRATE 0.5 MG X 11 & 1 MG X 42 PO MISC: ORAL | Status: DC

## 2015-04-22 NOTE — Patient Instructions (Addendum)
Thank you for coming in today. It was a pleasure meeting you. I look forward to being your primary doctor.  Steven [name] was seen today for diabetes, goiter and hypertension.  Diagnoses and all orders for this visit:  Type 2 diabetes mellitus with complication, without long-term current use of insulin (HCC) -     Microalbumin/Creatinine Ratio, Urine -     Glucose (CBG) -     HgB A1c -     sitaGLIPtin-metformin (JANUMET) 50-1000 MG tablet; Take 1 tablet by mouth 2 (two) times daily with a meal.  Essential hypertension -     losartan (COZAAR) 50 MG tablet; Take 1 tablet (50 mg total) by mouth daily. -     chlorthalidone (HYGROTON) 25 MG tablet; Take 1 tablet (25 mg total) by mouth daily.  SOB (shortness of breath) -     Pro b natriuretic peptide  Current smoker -     varenicline (CHANTIX STARTING MONTH PAK) 0.5 MG X 11 & 1 MG X 42 tablet; Taper per packet insert -     varenicline (CHANTIX CONTINUING MONTH PAK) 1 MG tablet; Take 1 tablet (1 mg total) by mouth 2 (two) times daily.  Onychomycosis of multiple toenails with type 2 diabetes mellitus (HCC) -     COMPLETE METABOLIC PANEL WITH GFR  .Smoking cessation support: smoking cessation hotline: 1-800-QUIT-NOW.  Smoking cessation classes are available through [facility]. Call (680)[phone] or visit our website at [email or web address].   STOP lasix and norvasc Add chlorthalidone 25 mg daily for BP control, refilled losartan and januvia   You will be called with lab results  F/u in 4-6 weeks for RN/pharmacy BP check F/u with me in 3 months  Dr. Armen Pickup

## 2015-04-22 NOTE — Assessment & Plan Note (Signed)
A;DM2 Med: compliant P: Continue [place]

## 2015-04-22 NOTE — Assessment & Plan Note (Addendum)
A; patient ready to quit P: chantix  Counseled patient on risk of SI

## 2015-04-22 NOTE — Assessment & Plan Note (Signed)
A; toenail fungus P: CMP Followed by lamisil if CMP normal

## 2015-04-22 NOTE — Assessment & Plan Note (Signed)
A; BP control with prn lasix. Peripheral edema on norvasc P: D.c lasix and norvasc Refilled losartan Start chlorthalidone

## 2015-04-22 NOTE — Progress Notes (Signed)
Subjective:  Patient ID: Steven [name], male    DOB: 1976-02-24  Age: 39 y.o. MRN: [id number]  CC: Diabetes; Goiter; and Hypertension   HPI Steven [name] presents for    1. CHRONIC DIABETES dx in 2012/2013   Disease Monitoring  Blood Sugar Ranges: not checking   Polyuria: no   Visual problems: no   Medication Compliance: yes  Medication Side Effects  Hypoglycemia: no   Preventitive Health Care  Eye Exam: done recently   Foot Exam: done today     2. .CHRONIC HYPERTENSION  Disease Monitoring  Blood pressure range: not checking   Chest pain: yes, L sided chest tightness    Dyspnea: yes, at  Rest, intermittent    Claudication: no   Medication compliance: yes, takes lasix 2-3 times per day  Medication Side Effects  Lightheadedness: no   Urinary frequency: no   Edema: yes   3. Goiter: x 2-3 years. Has hx of benign biopsy. Mom with goiter. No weight changes. Swelling in legs. No anxiety or depressed mood. He has f/u at [facility] in 3 weeks.   4. Current smoker: desire to quit. Denies SI. Would like to try chantix. Has some SOB and CP at rest. L breast CP, tightness. Non-radiating.   Past Medical History  Diagnosis Date  . COPD (chronic obstructive pulmonary disease) (HCC)   . Diabetes mellitus without complication (HCC)   . Hypertension   . Hypercholesterolemia   . Sleep apnea   . Cigarette smoker     History reviewed. No pertinent past surgical history.  Family History  Problem Relation Age of Onset  . Cancer Mother   . Diabetes Father     Social History  Substance Use Topics  . Smoking status: Current Every Day Smoker  . Smokeless tobacco: Not on file  . Alcohol Use: Yes    ROS Review of Systems  Constitutional: Negative for fever, chills, fatigue and unexpected weight change.  HENT: Negative for trouble swallowing.   Eyes: Negative for visual disturbance.  Respiratory: Positive for shortness of breath. Negative for cough.   Cardiovascular:  Positive for chest pain. Negative for palpitations and leg swelling.  Gastrointestinal: Negative for nausea, vomiting, abdominal pain, diarrhea, constipation and blood in stool.  Endocrine: Negative for polydipsia, polyphagia and polyuria.  Musculoskeletal: Negative for myalgias, back pain, arthralgias, gait problem and neck pain.  Skin: Negative for rash.  Allergic/Immunologic: Negative for immunocompromised state.  Hematological: Negative for adenopathy. Does not bruise/bleed easily.  Psychiatric/Behavioral: Negative for suicidal ideas, sleep disturbance and dysphoric mood. The patient is not nervous/anxious.     Objective:   Today's Vitals: BP 133/84 mmHg  Pulse 73  Temp(Src) 98.1 F (36.7 C) (Oral)  Resp 16  Ht  (1.854 m)  Wt 288 lb (130.636 kg)  BMI 38.01 kg/m2  SpO2 98%  Wt Readings from Last 3 Encounters:  04/22/15 288 lb (130.636 kg)  03/01/15 280 lb (127.007 kg)  12/29/14 285 lb 8 oz (129.502 kg)   BP Readings from Last 3 Encounters:  04/03/15 140/69  03/01/15 143/98  02/16/15 101/64    Physical Exam  Constitutional: He appears well-developed and well-nourished. No distress.  HENT:  [name]: Normocephalic and atraumatic.  Mouth/Throat: Oropharynx is clear and moist.  Eyes: Conjunctivae and EOM are normal. Pupils are equal, round, and reactive to light.  Neck: Normal range of motion. Neck supple. Thyromegaly present.  Cardiovascular: Normal rate, regular rhythm, normal heart sounds and intact distal pulses.  Pulmonary/Chest: Effort normal and breath sounds normal.  Musculoskeletal: He exhibits edema (trace LE edema ).  Lymphadenopathy:    He has no cervical adenopathy.  Neurological: He is alert.  Skin: Skin is warm and dry. No rash noted. No erythema.  Toenail thickened and yellow   Psychiatric: He has a normal mood and affect.   Lab Results  Component Value Date   HGBA1C 6.30 04/22/2015   CBG 107  Assessment & Plan:   [place]on was seen today for  diabetes, goiter and hypertension.  Diagnoses and all orders for this visit:  Type 2 diabetes mellitus with complication, without long-term current use of insulin (HCC) -     Microalbumin/Creatinine Ratio, Urine -     Glucose (CBG) -     HgB A1c -     sitaGLIPtin-metformin (JANUMET) 50-1000 MG tablet; Take 1 tablet by mouth 2 (two) times daily with a meal. -     Ambulatory referral to Ophthalmology  Essential hypertension -     losartan (COZAAR) 50 MG tablet; Take 1 tablet (50 mg total) by mouth daily. -     chlorthalidone (HYGROTON) 25 MG tablet; Take 1 tablet (25 mg total) by mouth daily.  SOB (shortness of breath) -     Pro b natriuretic peptide  Current smoker -     varenicline (CHANTIX STARTING MONTH PAK) 0.5 MG X 11 & 1 MG X 42 tablet; Taper per packet insert -     varenicline (CHANTIX CONTINUING MONTH PAK) 1 MG tablet; Take 1 tablet (1 mg total) by mouth 2 (two) times daily.  Onychomycosis of multiple toenails with type 2 diabetes mellitus (HCC) -     COMPLETE METABOLIC PANEL WITH GFR  Goiter -     TSH -     T4, free -     T3, Free  Healthcare maintenance -     Flu Vaccine QUAD 36+ mos IM    Follow-up: No Follow-up on file.    Dessa Phi MD

## 2015-04-22 NOTE — Assessment & Plan Note (Signed)
A: SOB in smoker, HTN, leg swelling. Recent CXR w/o cardiomegaly P: proBNP Smoking cessation

## 2015-04-22 NOTE — Progress Notes (Signed)
Establish Care Hx DM, Sleep apnia HTN Goiter  No pain today  Hx tobacco 1ppday

## 2015-04-23 LAB — MICROALBUMIN / CREATININE URINE RATIO
Creatinine, Urine: 150.2 mg/dL
MICROALB/CREAT RATIO: 192.4 mg/g — AB (ref 0.0–30.0)
Microalb, Ur: 28.9 mg/dL — ABNORMAL HIGH (ref <2.0)

## 2015-04-23 LAB — PRO B NATRIURETIC PEPTIDE: Pro B Natriuretic peptide (BNP): 21.38 pg/mL (ref <126)

## 2015-04-24 ENCOUNTER — Telehealth: Payer: Self-pay | Admitting: *Deleted

## 2015-04-24 DIAGNOSIS — R809 Proteinuria, unspecified: Secondary | ICD-10-CM | POA: Insufficient documentation

## 2015-04-24 DIAGNOSIS — B351 Tinea unguium: Secondary | ICD-10-CM

## 2015-04-24 DIAGNOSIS — E1129 Type 2 diabetes mellitus with other diabetic kidney complication: Secondary | ICD-10-CM | POA: Insufficient documentation

## 2015-04-24 DIAGNOSIS — E1169 Type 2 diabetes mellitus with other specified complication: Secondary | ICD-10-CM

## 2015-04-24 MED ORDER — LOSARTAN POTASSIUM 100 MG PO TABS: 100.0000 mg | ORAL_TABLET | Freq: Every day | ORAL | Status: DC

## 2015-04-24 NOTE — Telephone Encounter (Signed)
-----   Message from Dessa Phi, MD sent at 04/24/2015  9:39 AM EDT ----- All labs normal Elevated urine microalbumin, patient to increase losartan to 100 mg to help protect kidneys Tight blood sugar and BP control

## 2015-04-24 NOTE — Addendum Note (Signed)
Addended by: Dessa Phi on: 04/24/2015 09:42 AM   Modules accepted: Orders, SmartSet

## 2015-04-24 NOTE — Telephone Encounter (Signed)
Date of birth verified by pt Labs results given  Pt advised to increased losartan to 100 mg for protection of kidney  Pt verbalized understanding

## 2015-04-27 MED ORDER — TERBINAFINE HCL 250 MG PO TABS: 250.0000 mg | ORAL_TABLET | Freq: Every day | ORAL | Status: DC

## 2015-04-30 ENCOUNTER — Telehealth: Payer: Self-pay | Admitting: Family Medicine

## 2015-04-30 DIAGNOSIS — E049 Nontoxic goiter, unspecified: Secondary | ICD-10-CM

## 2015-04-30 DIAGNOSIS — R809 Proteinuria, unspecified: Secondary | ICD-10-CM

## 2015-04-30 DIAGNOSIS — E1129 Type 2 diabetes mellitus with other diabetic kidney complication: Secondary | ICD-10-CM

## 2015-04-30 NOTE — Telephone Encounter (Signed)
Pt stated unable to make it to [place] for Endocrinologist appointment due to transportation Requesting endocrinologist referral in [place] Previews PCP referred him.

## 2015-04-30 NOTE — Telephone Encounter (Signed)
Patient called stating that he is suppose to go and see a endocrinologist on 05/01/15, but pt. Stated  that PCP wanted him to do blood work first before he goes to the appt. Pt. Did the bood work on his last visit on 04/22/15. Pt. Would like to know if he should go to the appt. Or cancel the appt. Pt. Would want to see an endocrinologist in [place], he has an appt. In [place]. Please f/u with pt.

## 2015-05-15 ENCOUNTER — Ambulatory Visit (INDEPENDENT_AMBULATORY_CARE_PROVIDER_SITE_OTHER): Payer: Self-pay | Admitting: Endocrinology

## 2015-05-15 ENCOUNTER — Encounter: Payer: Self-pay | Admitting: Endocrinology

## 2015-05-15 VITALS — BP 130/92 | HR 83 | Temp 98.3°F | Ht 73.0 in | Wt 286.0 lb

## 2015-05-15 DIAGNOSIS — E049 Nontoxic goiter, unspecified: Secondary | ICD-10-CM

## 2015-05-15 NOTE — Patient Instructions (Addendum)
good diet and exercise significantly improve the control of your diabetes.  please let me know if you wish to be referred to a dietician.  high blood sugar is very risky to your health.  you should see an eye doctor and dentist every year.  It is very important to get all recommended vaccinations.  controlling your blood pressure and cholesterol drastically reduces the damage diabetes does to your body.  Those who smoke should quit.  please discuss these with your doctor.   check your blood sugar once a week.  vary the time of day when you check, between before the 3 meals, and at bedtime.  also check if you have symptoms of your blood sugar being too high or too low.  please keep a record of the readings and bring it to your next appointment here (or you can bring the meter itself).  You can write it on any piece of paper.  please call us sooner if your blood sugar goes below 70, or if you have a lot of readings over 200.   Please see a surgery specialist.  you will receive a phone call, about a day and time for an appointment.   Please come back for a follow-up appointment in 3 months.

## 2015-05-15 NOTE — Progress Notes (Signed)
Subjective:    Patient ID: Steven [name], male    DOB: [birth year], 39 y.o.   MRN: [id number]  HPI pt states DM was dx'ed in 2012; he has mild if any neuropathy of the lower extremities; he is unaware of any associated chronic complications; he has never been on insulin; pt says his diet and exercise are not good; he has never had pancreatitis, severe hypoglycemia or DKA.   Pt was noted to have a goiter in 2012.  He has no h/o XRT or surgery to the neck.  In 2012, he had bilat bxs. On a more recent ov, pt was referred to surgery, at his request.  Past Medical History  Diagnosis Date  . COPD (chronic obstructive pulmonary disease) (HCC)   . Hypertension   . Hypercholesterolemia   . Sleep apnea   . Cigarette smoker   . Diabetes mellitus without complication (HCC) 2012    No past surgical history on file.  Social History   Social History  . Marital Status: Single    Spouse Name: N/A  . Number of Children: N/A  . Years of Education: N/A   Occupational History  . Not on file.   Social History Main Topics  . Smoking status: Current Every Day Smoker  . Smokeless tobacco: Not on file  . Alcohol Use: No  . Drug Use: No  . Sexual Activity: Not on file   Other Topics Concern  . Not on file   Social History Narrative    Current Outpatient Prescriptions on File Prior to Visit  Medication Sig Dispense Refill  . chlorthalidone (HYGROTON) 25 MG tablet Take 1 tablet (25 mg total) by mouth daily. 30 tablet 5  . omeprazole (PRILOSEC) 40 MG capsule Take 1 capsule (40 mg total) by mouth daily. 30 capsule 1  . sitaGLIPtin-metformin (JANUMET) 50-1000 MG tablet Take 1 tablet by mouth 2 (two) times daily with a meal. 60 tablet 5  . albuterol (PROVENTIL HFA;VENTOLIN HFA) 108 (90 BASE) MCG/ACT inhaler Inhale 2 puffs into the lungs every 6 (six) hours as needed for wheezing or shortness of breath. (Patient not taking: Reported on 05/15/2015) 1 Inhaler 1  . losartan (COZAAR) 100 MG tablet  Take 1 tablet (100 mg total) by mouth daily. (Patient not taking: Reported on 05/15/2015) 30 tablet 5  . naproxen (NAPROSYN) 500 MG tablet Take 1 tablet (500 mg total) by mouth 2 (two) times daily. (Patient not taking: Reported on 05/15/2015) 30 tablet 0  . rosuvastatin (CRESTOR) 40 MG tablet Take 40 mg by mouth daily.    Marland Kitchen terbinafine (LAMISIL) 250 MG tablet Take 1 tablet (250 mg total) by mouth daily. (Patient not taking: Reported on 05/15/2015) 30 tablet 2  . varenicline (CHANTIX CONTINUING MONTH PAK) 1 MG tablet Take 1 tablet (1 mg total) by mouth 2 (two) times daily. (Patient not taking: Reported on 05/15/2015) 60 tablet 1  . varenicline (CHANTIX STARTING MONTH PAK) 0.5 MG X 11 & 1 MG X 42 tablet Taper per packet insert (Patient not taking: Reported on 05/15/2015) 53 tablet 0   No current facility-administered medications on file prior to visit.    Allergies  Allergen Reactions  . Penicillins     Family History  Problem Relation Age of Onset  . Cancer Mother   . Diabetes Father   . Thyroid disease Paternal Uncle     BP 130/92 mmHg  Pulse 83  Temp(Src) 98.3 F (36.8 C) (Oral)  Ht  (1.854 m)  Wt 286 lb (129.729 kg)  BMI 37.74 kg/m2  SpO2 93%  Review of Systems denies chest pain, sob, n/v, depression, cold intolerance, and easy bruising, hoarseness, neck pain, visual loss, skin rash, numbness, and rhinorrhea.   He has slight dysphagia, headache, urinary frequency, leg cramps, excessive diaphoresis, and weight gain.      Objective:   Physical Exam VS: see vs page GEN: no distress HEAD: head: no deformity eyes: no periorbital swelling, no proptosis external nose and ears are normal mouth: no lesion seen NECK: large goiter, with irreg surface.   CHEST WALL: no deformity LUNGS: clear to auscultation BREASTS:  No gynecomastia.   CV: reg rate and rhythm, no murmur.   ABD: abdomen is soft, nontender.  no hepatosplenomegaly.  not distended.  no hernia.     MUSCULOSKELETAL: muscle bulk and strength are grossly normal.  no obvious joint swelling.  gait is normal and steady.   EXTEMITIES: no deformity.  no ulcer on the feet.  feet are of normal color and temp.  1+ bilat leg edema. There is bilateral onychomycosis of the toenails.   PULSES: dorsalis pedis intact bilat.  no carotid bruit.   NEURO:  cn 2-12 grossly intact.   readily moves all 4's.  sensation is intact to touch on the feet SKIN:  Normal texture and temperature.  No rash or suspicious lesion is visible.  There is patchy hyperpigmentation of the ankles NODES:  None palpable at the neck.   PSYCH: alert, well-oriented.  Does not appear anxious nor depressed.    Radiol: Thyroid US: 05/01/15 in office by Dr. Amalia Hailey Compared to prior US report 2012. Large multinodular goiter with multiple confluent nodules replacing both lobes. Difficult to compare to prior study due to confluent borders and multitude of nodules. They do overall have similar appearance.  I have reviewed outside records, and summarized: Pt was noted to have large multinodular goiter.  He was ref to surg at pt's request  2012: thyroid bx cytology: non-neoplastic thyroid lesion.   Lab Results  Component Value Date   TSH 0.527 04/22/2015   Lab Results  Component Value Date   HGBA1C 6.30 04/22/2015       Assessment & Plan:  DM: well-controlled Large multinodular goiter: pt wants to consider thyroidectomy.    Patient is advised the following: Patient Instructions  good diet and exercise significantly improve the control of your diabetes.  please let me know if you wish to be referred to a dietician.  high blood sugar is very risky to your health.  you should see an eye doctor and dentist every year.  It is very important to get all recommended vaccinations.  controlling your blood pressure and cholesterol drastically reduces the damage diabetes does to your body.  Those who smoke should quit.  please discuss these with  your doctor.   check your blood sugar once a week.  vary the time of day when you check, between before the 3 meals, and at bedtime.  also check if you have symptoms of your blood sugar being too high or too low.  please keep a record of the readings and bring it to your next appointment here (or you can bring the meter itself).  You can write it on any piece of paper.  please call us sooner if your blood sugar goes below 70, or if you have a lot of readings over 200.   Please see a surgery specialist.  you will receive a phone call,  about a day and time for an appointment.   Please come back for a follow-up appointment in 3 months.

## 2015-06-02 ENCOUNTER — Other Ambulatory Visit: Payer: Self-pay | Admitting: *Deleted

## 2015-06-02 MED ORDER — FLUTICASONE PROPIONATE 50 MCG/ACT NA SUSP: 2.0000 | Freq: Every day | NASAL | Status: DC

## 2015-06-15 ENCOUNTER — Encounter: Payer: Self-pay | Admitting: Endocrinology

## 2015-06-15 HISTORY — PX: THYROIDECTOMY: SHX17

## 2015-06-22 ENCOUNTER — Telehealth: Payer: Self-pay | Admitting: Family Medicine

## 2015-06-22 NOTE — Telephone Encounter (Signed)
Returned pt call Pt has scratchy throat and trouble  swallowing after surgery  Stated still smocking some, was told not to. Advised to call surgeon for F/U, Pt has F/U appointment next week.

## 2015-06-22 NOTE — Telephone Encounter (Signed)
Pt. Called requesting to speak to PCP regarding his surgery that he had on 06/15/15. Pt. Stated he has trouble swallowing. Please f/u with pt.

## 2015-06-23 ENCOUNTER — Telehealth: Payer: Self-pay | Admitting: Family Medicine

## 2015-06-23 NOTE — Telephone Encounter (Signed)
Patient called stating that Steven [name] will be picking up a letter for him, letter will be in the accordion.

## 2015-06-24 ENCOUNTER — Other Ambulatory Visit: Payer: Self-pay | Admitting: *Deleted

## 2015-06-24 MED ORDER — OMEPRAZOLE 40 MG PO CPDR: 40.0000 mg | DELAYED_RELEASE_CAPSULE | Freq: Every day | ORAL | Status: DC

## 2015-06-24 NOTE — Telephone Encounter (Signed)
Rx refills send to [facility]  Pt notified.  

## 2015-06-24 NOTE — Telephone Encounter (Signed)
Patient called and requested a med refill for omeprazole (PRILOSEC) 40 MG capsule. Please f/u with pt.

## 2015-06-26 ENCOUNTER — Encounter: Payer: Self-pay | Admitting: Family Medicine

## 2015-07-01 ENCOUNTER — Telehealth: Payer: Self-pay | Admitting: Family Medicine

## 2015-07-01 NOTE — Telephone Encounter (Signed)
Patient called and requested to speak with PCP or PCP's nurse in regards to some medical questions he has. Please f/u

## 2015-07-06 NOTE — Telephone Encounter (Signed)
Pt calling requesting to talk to provider regarding medication

## 2015-07-07 ENCOUNTER — Telehealth: Payer: Self-pay

## 2015-07-07 DIAGNOSIS — I10 Essential (primary) hypertension: Secondary | ICD-10-CM

## 2015-07-07 DIAGNOSIS — E118 Type 2 diabetes mellitus with unspecified complications: Secondary | ICD-10-CM

## 2015-07-07 DIAGNOSIS — R809 Proteinuria, unspecified: Secondary | ICD-10-CM

## 2015-07-07 DIAGNOSIS — E1129 Type 2 diabetes mellitus with other diabetic kidney complication: Secondary | ICD-10-CM

## 2015-07-07 NOTE — Telephone Encounter (Signed)
Nurse called patient, reached voicemail. Left message for patient to call Steven [name] with [facility], at [phone]. Nurse called patient due to patient leaving message on nurses voicemail with name, date of birth, and telephone number. Nurse called patient to assess patients needs.

## 2015-07-09 ENCOUNTER — Telehealth: Payer: Self-pay | Admitting: Endocrinology

## 2015-07-09 NOTE — Telephone Encounter (Signed)
Steven [name], We do not see this pt.

## 2015-07-09 NOTE — Telephone Encounter (Addendum)
Maris[facility]iz [facility] , stated patient didn't show up to appointment.   This patient was seen as a new patient with Dr Everardo AllEllison 05/15/15 @ 1:30

## 2015-07-10 MED ORDER — LOSARTAN POTASSIUM 100 MG PO TABS: 100.0000 mg | ORAL_TABLET | Freq: Every day | ORAL | Status: DC

## 2015-07-10 NOTE — Telephone Encounter (Signed)
Patient called. Call completed He has ED on thiazide diuretic.  Plan stop chlorthalidone Start back on losartan 100 mg daily sent to nearby [facility] on [address] per patient request.  Patient asked to call in next week on 07/14/15 for f/u appt w/in next month to f/u HTN and numbness in 4th and 5th finger on R hand, ? Ulnar neuropathy.   Patient agreed with plan and voiced understanding.

## 2015-07-10 NOTE — Telephone Encounter (Signed)
Nurse called patient, patient verified date of birth. Patient reports he needs to see Dr. Armen PickupFunches because the BP medication is messing with him and he wants something else. Patient reports chlorthalidone is "messing with my nature". Nurse clarified, patient thinks medication is effecting his sexual performance, patient is unable to get an erection.  Patient reports this has been occuring since starting chlorthalidone.  Patient aware of nurse sending message to Dr. Armen PickupFunches.

## 2015-07-10 NOTE — Telephone Encounter (Signed)
Please read original message concerning pt that you saw on 10/28.

## 2015-07-12 NOTE — Telephone Encounter (Signed)
please call patient: Please come back for a follow-up appointment in 6 weeks

## 2015-07-21 NOTE — Telephone Encounter (Signed)
Appointment letter mailed to the pt.  

## 2015-07-23 MED FILL — LEVOTHYROXINE 200 MCG TAB: 200 | 30 days supply | Qty: 30 | Fill #1

## 2015-07-31 ENCOUNTER — Encounter: Payer: Self-pay | Admitting: Family Medicine

## 2015-07-31 ENCOUNTER — Ambulatory Visit: Payer: Self-pay | Attending: Family Medicine | Admitting: Family Medicine

## 2015-07-31 VITALS — BP 133/73 | HR 86 | Temp 97.7°F | Resp 16 | Ht 72.0 in | Wt 295.0 lb

## 2015-07-31 DIAGNOSIS — Z7984 Long term (current) use of oral hypoglycemic drugs: Secondary | ICD-10-CM | POA: Insufficient documentation

## 2015-07-31 DIAGNOSIS — G5621 Lesion of ulnar nerve, right upper limb: Secondary | ICD-10-CM | POA: Insufficient documentation

## 2015-07-31 DIAGNOSIS — I10 Essential (primary) hypertension: Secondary | ICD-10-CM | POA: Insufficient documentation

## 2015-07-31 DIAGNOSIS — F17200 Nicotine dependence, unspecified, uncomplicated: Secondary | ICD-10-CM

## 2015-07-31 DIAGNOSIS — E890 Postprocedural hypothyroidism: Secondary | ICD-10-CM | POA: Insufficient documentation

## 2015-07-31 DIAGNOSIS — Z79899 Other long term (current) drug therapy: Secondary | ICD-10-CM | POA: Insufficient documentation

## 2015-07-31 DIAGNOSIS — E119 Type 2 diabetes mellitus without complications: Secondary | ICD-10-CM | POA: Insufficient documentation

## 2015-07-31 DIAGNOSIS — F17210 Nicotine dependence, cigarettes, uncomplicated: Secondary | ICD-10-CM | POA: Insufficient documentation

## 2015-07-31 DIAGNOSIS — E118 Type 2 diabetes mellitus with unspecified complications: Secondary | ICD-10-CM

## 2015-07-31 DIAGNOSIS — Z720 Tobacco use: Secondary | ICD-10-CM

## 2015-07-31 DIAGNOSIS — E039 Hypothyroidism, unspecified: Secondary | ICD-10-CM

## 2015-07-31 DIAGNOSIS — Z6841 Body Mass Index (BMI) 40.0 and over, adult: Secondary | ICD-10-CM | POA: Insufficient documentation

## 2015-07-31 LAB — GLUCOSE, POCT (MANUAL RESULT ENTRY): POC Glucose: 103 mg/dl — AB (ref 70–99)

## 2015-07-31 LAB — POCT GLYCOSYLATED HEMOGLOBIN (HGB A1C): Hemoglobin A1C: 6.3

## 2015-07-31 MED ORDER — TRUE METRIX METER W/DEVICE KIT: 1.0000 | PACK | Status: AC | PRN

## 2015-07-31 MED ORDER — GABAPENTIN 300 MG PO CAPS: 300.0000 mg | ORAL_CAPSULE | Freq: Every day | ORAL | Status: DC

## 2015-07-31 MED ORDER — BUPROPION HCL ER (SR) 150 MG PO TB12: 150.0000 mg | ORAL_TABLET | Freq: Two times a day (BID) | ORAL | Status: DC

## 2015-07-31 MED ORDER — TRUEPLUS LANCETS 28G MISC: 1.0000 | Status: AC

## 2015-07-31 MED ORDER — GLUCOSE BLOOD VI STRP: 1.0000 | ORAL_STRIP | Status: AC

## 2015-07-31 MED FILL — GABAPENTIN 300 MG CAPSULE: 300 | 30 days supply | Qty: 30 | Fill #0

## 2015-07-31 MED FILL — BUPROPION SR 150 MG TABLET: 150 | 30 days supply | Qty: 60 | Fill #0

## 2015-07-31 NOTE — Progress Notes (Signed)
F/U DM Glucose running 90-120 Tobacco user 1/2 ppday  Pain scale #6 on neck and arm  No suicidal thought in the past two weeks

## 2015-07-31 NOTE — Assessment & Plan Note (Signed)
A: controlled. Patient has stopped chlorthalidone due to ED Med: compliant with losartan 100 mg daily P: Continue losartan 100 mg daily

## 2015-07-31 NOTE — Progress Notes (Signed)
Subjective:  Patient ID: Steven [name], male    DOB: 12-13-1975  Age: 40 y.o. MRN: [id number]  CC: Hypertension and Diabetes   HPI YALE GOLLA presents for   1. CHRONIC DIABETES  Disease Monitoring  Blood Sugar Ranges: not checking   Polyuria: no   Visual problems: no   Medication Compliance: yes, but taking janumet once daily only  Medication Side Effects  Hypoglycemia: no   Preventitive Health Care  Eye Exam: due   Foot Exam:   Diet pattern: eats breads   Exercise: minimal   2. CHRONIC HYPERTENSION  Disease Monitoring  Blood pressure range: not checking   Chest pain: no   Dyspnea: no   Claudication: no   Medication compliance: yes  Medication Side Effects  Lightheadedness: no   Urinary frequency: no   Edema: no     3. Smoking: 1/2 PPD. Desires to quit. Smokes to relieve stress. He did not get to try chantix. He admits to depressed mood. He is willing to try wellbutrin.   4. Numbness: in 4th and 5th finger of R hand. X one month. Since thyroidectomy one month ago. Numbness is intermittent. He also has occasional pain.   Past Surgical History  Procedure Laterality Date  . Thyroidectomy Bilateral 06/15/2015    Social History  Substance Use Topics  . Smoking status: Current Every Day Smoker  . Smokeless tobacco: Not on file  . Alcohol Use: No    Outpatient Prescriptions Prior to Visit  Medication Sig Dispense Refill  . albuterol (PROVENTIL HFA;VENTOLIN HFA) 108 (90 BASE) MCG/ACT inhaler Inhale 2 puffs into the lungs every 6 (six) hours as needed for wheezing or shortness of breath. 1 Inhaler 1  . fluticasone (FLONASE) 50 MCG/ACT nasal spray Place 2 sprays into both nostrils daily. 16 g 3  . losartan (COZAAR) 100 MG tablet Take 1 tablet (100 mg total) by mouth daily. 30 tablet 0  . naproxen (NAPROSYN) 500 MG tablet Take 1 tablet (500 mg total) by mouth 2 (two) times daily. 30 tablet 0  . omeprazole (PRILOSEC) 40 MG capsule Take 1 capsule (40 mg  total) by mouth daily. 30 capsule 1  . rosuvastatin (CRESTOR) 40 MG tablet Take 40 mg by mouth daily.    . sitaGLIPtin-metformin (JANUMET) 50-1000 MG tablet Take 1 tablet by mouth 2 (two) times daily with a meal. 60 tablet 5  . terbinafine (LAMISIL) 250 MG tablet Take 1 tablet (250 mg total) by mouth daily. (Patient not taking: Reported on 07/31/2015) 30 tablet 2  . varenicline (CHANTIX CONTINUING MONTH PAK) 1 MG tablet Take 1 tablet (1 mg total) by mouth 2 (two) times daily. (Patient not taking: Reported on 05/15/2015) 60 tablet 1  . varenicline (CHANTIX STARTING MONTH PAK) 0.5 MG X 11 & 1 MG X 42 tablet Taper per packet insert (Patient not taking: Reported on 05/15/2015) 53 tablet 0   No facility-administered medications prior to visit.    ROS Review of Systems  Constitutional: Negative for fever, chills, fatigue and unexpected weight change.  Eyes: Negative for visual disturbance.  Respiratory: Negative for cough and shortness of breath.   Cardiovascular: Negative for chest pain, palpitations and leg swelling.  Gastrointestinal: Negative for nausea, vomiting, abdominal pain, diarrhea, constipation and blood in stool.  Endocrine: Negative for polydipsia, polyphagia and polyuria.  Musculoskeletal: Negative for myalgias, back pain, arthralgias, gait problem and neck pain.  Skin: Negative for rash.  Allergic/Immunologic: Negative for immunocompromised state.  Neurological: Positive for numbness (in  4th and 5th fingers of R hand ).  Hematological: Negative for adenopathy. Does not bruise/bleed easily.  Psychiatric/Behavioral: Positive for dysphoric mood. Negative for suicidal ideas and sleep disturbance. The patient is not nervous/anxious.     Objective:  BP 133/73 mmHg  Pulse 86  Temp(Src) 97.7 F (36.5 C) (Oral)  Resp 16  Ht 6' (1.829 m)  Wt 295 lb (133.811 kg)  BMI 40.00 kg/m2  SpO2 96%  BP/Weight 07/31/2015 05/15/2015 37/01/9395  Systolic BP 886 484 720  Diastolic BP 73 92 84    Wt. (Lbs) 295 286 288  BMI 40 37.74 38.01    Physical Exam  Constitutional: He appears well-developed and well-nourished. No distress.  HENT:  Head: Normocephalic and atraumatic.  Neck: Normal range of motion. Neck supple.  Healed anterior neck scar  Cardiovascular: Normal rate, regular rhythm, normal heart sounds and intact distal pulses.   Pulmonary/Chest: Effort normal and breath sounds normal.  Musculoskeletal: He exhibits no edema.  Neurological: He is alert.  Skin: Skin is warm and dry. No rash noted. No erythema.  Psychiatric: He has a normal mood and affect.   Lab Results  Component Value Date   HGBA1C 6.30 04/22/2015   Lab Results  Component Value Date   HGBA1C 6.30 07/31/2015    CBG 104 Assessment & Plan:   Problem List Items Addressed This Visit    Current smoker (Chronic)   Relevant Medications   buPROPion (WELLBUTRIN SR) 150 MG 12 hr tablet   DM2 (diabetes mellitus, type 2) (HCC) - Primary (Chronic)    Well controlled diabetes Trial off janumet Log fasting sugars  F/u in 4 weeks to review fasting sugars       Relevant Medications   Blood Glucose Monitoring Suppl (TRUE METRIX METER) w/Device KIT   glucose blood (TRUE METRIX BLOOD GLUCOSE TEST) test strip   TRUEPLUS LANCETS 28G MISC   Other Relevant Orders   POCT glycosylated hemoglobin (Hb A1C) (Completed)   POCT glucose (manual entry) (Completed)   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    Follow-up: No Follow-up on file.   Boykin Nearing MD

## 2015-07-31 NOTE — Assessment & Plan Note (Signed)
Well controlled diabetes Trial off janumet Log fasting sugars  F/u in 4 weeks to review fasting sugars

## 2015-07-31 NOTE — Patient Instructions (Addendum)
Steven [name] was seen today for hypertension and diabetes.  Diagnoses and all orders for this visit:  Type 2 diabetes mellitus with complication, without long-term current use of insulin (HCC) -     POCT glycosylated hemoglobin (Hb A1C) -     POCT glucose (manual entry) -     Blood Glucose Monitoring Suppl (TRUE METRIX METER) w/Device KIT; 1 each by Does not apply route as needed. -     glucose blood (TRUE METRIX BLOOD GLUCOSE TEST) test strip; 1 each by Other route every morning. -     TRUEPLUS LANCETS 28G MISC; 1 each by Does not apply route every morning.  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  Current smoker -     buPROPion (WELLBUTRIN SR) 150 MG 12 hr tablet; Take 1 tablet (150 mg total) by mouth 2 (two) times daily. Once daily in morning for 3 days, then twice daily, take second dose before 5 PM  Hypothyroidism (acquired) -     TSH  Smoking cessation support: smoking cessation hotline: 1-800-QUIT-NOW.  Smoking cessation classes are available through [facility]. Call [phone] or visit our website at [email or web address]   Diabetes is very well controlled   Trial off janumet, STOP janumet  Low carb diet: minimize breads  I recommend that you check and write down your fasting sugars 2-3 times a week, restart jaumet if it is high   Diabetes blood sugar goals  Fasting (in AM before breakfast, 8 hrs of no eating or drinking (except water or unsweetened coffee or tea): 90-130   F/u in 4 weeks to review fasting sugars  F/u with me in 8 weeks for ulnar nerve neuropathy and and smoking cessation   Dr. Phillips Hay Nerve Contusion With Rehab The ulnar nerve lies near the surface of the skin as it passes by the elbow. This location causes it to be susceptible to injury. An ulnar nerve contusion is a bruise of the nerve. It is the result of direct trauma to the  elbow. Ulnar nerve contusions are characterized by pain, weakness, and loss of feeling in the hand. SYMPTOMS   Signs of nerve damage include: tingling, numbness, weakness, and/or loss of feeling in the hand, specifically the little finger and ring finger.  Sharp pains that may shoot from the elbow to the wrist and hand.  Decreased hand function.  Tenderness and/ or inflammation in the elbow.  Muscle wasting (atrophy) in the hand. CAUSES  Ulnar nerve contusions are caused by direct trauma to the elbow that results in bleeding which enters the nerve. RISK INCREASES WITH:  Contact sports (football, soccer, or rugby).  Bleeding disorders.  Taking blood thinning medicine (warfarin [Coumadin], aspirin, or nonsteroidal anti-inflammatory medications).  Diabetes mellitus.  Underactive thyroid gland (hypothyroidism). PREVENTION  Wear properly fitted and padded protective equipment.  Only take blood thinning medication when necessary. PROGNOSIS  Ulnar nerve contusions usually heal within 6 weeks. Healing often occurs spontaneously, but treatment helps reduce symptoms.  RELATED COMPLICATIONS   Permanent nerve damage, including pain, numbness, tingling, or weakness in the hand (rare).  Weak grip.  Prolonged healing time, if improperly treated or re-injured. TREATMENT  Treatment initially involves resting from any activities that aggravate the symptoms, and the use of ice and medications to help reduce pain and inflammation. The use of strengthening and stretching exercises may help reduce  pain with activity. These exercises may be performed at home or with referral to a therapist. Your caregiver may recommend that you splint the elbow at night to help healing of the nerve. If symptoms persist despite conservative (non-surgical) treatment, then surgery may be recommended to free the nerve. MEDICATION   If pain medication is necessary, then nonsteroidal anti-inflammatory medications, such  as aspirin and ibuprofen, or other minor pain relievers, such as acetaminophen, are often recommended.  Do not take pain medication within 7 days before surgery.  Prescription pain relievers may be given if deemed necessary by your caregiver. Use only as directed and only as much as you need. COLD THERAPY  Cold treatment (icing) relieves pain and reduces inflammation. Cold treatment should be applied for 10 to 15 minutes every 2 to 3 hours for inflammation and pain and immediately after any activity that aggravates your symptoms. Use ice packs or massage the area with a piece of ice (ice massage). SEEK MEDICAL CARE IF:   Treatment seems to offer no benefit, or the condition worsens.  Any medications produce adverse side effects. EXERCISES RANGE OF MOTION (ROM) AND STRETCHING EXERCISES - Ulnar Nerve Contusion These exercises may help you when beginning to rehabilitate your injury. Do not begin these exercises until your physician, physical therapist or athletic trainer advises you to do so. Discontinue any exercise that worsens your symptoms. Contact your physician with instructions on how to continue. Your symptoms may resolve with or without further involvement from your physician, physical therapist or athletic trainer. While completing these exercises, remember:  Restoring tissue flexibility helps normal motion to return to the joints. This allows healthier, less painful movement and activity.  An effective stretch should be held for at least 30 seconds.  A stretch should never be painful. You should only feel a gentle lengthening or release in the stretched tissue. RANGE OF MOTION - Extension  Hold your right / left arm at your side and straighten your elbow as far as you can using your right / left arm muscles.  Straighten the right / left elbow farther by gently pushing down on your forearm until you feel a gentle stretch on the inside of your elbow. Hold this position for __________  seconds.  Slowly return to the starting position. Repeat __________ times. Complete this exercise __________ times per day.  RANGE OF MOTION - Flexion  Hold your right / left arm at your side and bend your elbow as far as you can using your right / left arm muscles.  Bend the right / left elbow farther by gently pushing up on your forearm until you feel a gentle stretch on the outside of your elbow. Hold this position for __________ seconds.  Slowly return to the starting position. Repeat __________ times. Complete this exercise __________ times per day.  RANGE OF MOTION - Wrist Flexion, Active-Assisted  Extend your right / left elbow with your fingers pointing down.*  Gently pull the back of your hand towards you until you feel a gentle stretch on the top of your forearm.  Hold this position for __________ seconds. Repeat __________ times. Complete this exercise __________ times per day.  *If directed by your physician, physical therapist or athletic trainer, complete this stretch with your elbow bent rather than extended. RANGE OF MOTION - Wrist Extension, Active-Assisted  Extend your right / left elbow and turn your palm upwards.*  Gently pull your palm/fingertips back so your wrist extends and your fingers point more toward the  ground.  You should feel a gentle stretch on the inside of your forearm.  Hold this position for __________ seconds. Repeat __________ times. Complete this exercise __________ times per day. *If directed by your physician, physical therapist or athletic trainer, complete this stretch with your elbow bent, rather than extended. RANGE OF MOTION - Supination, Active  Stand or sit with your elbows at your side. Bend your right / left elbow to 90 degrees.  Turn your palm upward until you feel a gentle stretch on the inside of your forearm.  Hold this position for __________ seconds. Slowly release and return to the starting position. Repeat __________  times. Complete this stretch __________ times per day.  RANGE OF MOTION - Pronation, Active  Stand or sit with your elbows at your side. Bend your right / left elbow to 90 degrees.  Turn your palm downward until you feel a gentle stretch on the top of your forearm.  Hold this position for __________ seconds. Slowly release and return to the starting position. Repeat __________ times. Complete this stretch __________ times per day.  STRETCH - Wrist Flexion   Place the back of your right / left hand on a tabletop leaving your elbow slightly bent. Your fingers should point away from your body.  Gently press the back of your hand down onto the table by straightening your elbow. You should feel a stretch on the top of your forearm.  Hold this position for __________ seconds. Repeat __________ times. Complete this stretch __________ times per day.  STRETCH - Wrist Extension   Place your right / left fingertips on a tabletop leaving your elbow slightly bent. Your fingers should point backwards.  Gently press your fingers and palm down onto the table by straightening your elbow. You should feel a stretch on the inside of your forearm.  Hold this position for __________ seconds. Repeat __________ times. Complete this stretch __________ times per day.  STRENGTHENING EXERCISES - Ulnar Nerve Contusion These exercises may help you when beginning to rehabilitate your injury. Do not begin these exercises until your physician, physical therapist or athletic trainer advises you to do so. Discontinue any exercise that worsens your symptoms. Contact your physician for instructions on how to continue. They may resolve your symptoms with or without further involvement from your physician, physical therapist or athletic trainer. While completing these exercises, remember:   Muscles can gain both the endurance and the strength needed for everyday activities through controlled exercises.  Complete these  exercises as instructed by your physician, physical therapist or athletic trainer. Progress with the resistance and repetition exercises only as your caregiver advises. STRENGTH - Wrist Flexors  Sit with your right / left forearm palm-up and fully supported. Your elbow should be resting below the height of your shoulder. Allow your wrist to extend over the edge of the surface.  Loosely holding a __________ weight or a piece of rubber exercise band/tubing, slowly curl your hand up toward your forearm.  Hold this position for __________ seconds. Slowly lower the wrist back to the starting position in a controlled manner. Repeat __________ times. Complete this exercise __________ times per day.  STRENGTH - Wrist Extensors  Sit with your right / left forearm palm-down and fully supported. Your elbow should be resting below the height of your shoulder. Allow your wrist to extend over the edge of the surface.  Loosely holding a __________ weight or a piece of rubber exercise band/tubing, slowly curl your hand up toward your  forearm.  Hold this position for __________ seconds. Slowly lower the wrist back to the starting position in a controlled manner. Repeat __________ times. Complete this exercise __________ times per day.  STRENGTH - Ulnar Deviators  Stand with a ____________________ weight in your right / left hand or sit holding on to the rubber exercise band/tubing with your opposite arm supported.  Move your wrist so that your pinkie travels toward your forearm and your thumb moves away from your forearm.  Hold this position for __________ seconds and then slowly lower the wrist back to the starting position. Repeat __________ times. Complete this exercise __________ times per day STRENGTH - Radial Deviators  Stand with a ____________________ weight in your right / left hand or sit holding on to the rubber exercise band/tubing with your arm supported.  Raise your hand upward in front of  you or pull up on the rubber tubing.  Hold this position for __________ seconds and then slowly lower the wrist back to the starting position. Repeat __________ times. Complete this exercise __________ times per day. STRENGTH - Grip  Grasp a tennis ball, a dense sponge, or a large, rolled sock in your hand.  Squeeze as hard as you can without increasing any pain.  Hold this position for __________ seconds. Release your grip slowly. Repeat __________ times. Complete this exercise __________ times per day.    This information is not intended to replace advice given to you by your health care provider. Make sure you discuss any questions you have with your health care provider.   Document Released: 07/04/2005 Document Revised: 11/18/2014 Document Reviewed: 10/16/2008 Elsevier Interactive Patient Education [employer].

## 2015-08-01 LAB — TSH: TSH: 0.243 u[IU]/mL — AB (ref 0.350–4.500)

## 2015-08-03 MED ORDER — LEVOTHYROXINE SODIUM 175 MCG PO TABS: 175.0000 ug | ORAL_TABLET | Freq: Every day | ORAL | Status: DC

## 2015-08-03 MED FILL — LEVOTHYROXINE 175 MCG TAB: 175 | 30 days supply | Qty: 30 | Fill #0

## 2015-08-03 NOTE — Addendum Note (Signed)
Addended by: Dessa PhiFUNCHES, Dreana Britz on: 08/03/2015 08:38 AM   Modules accepted: Orders, SmartSet

## 2015-08-04 ENCOUNTER — Telehealth: Payer: Self-pay

## 2015-08-04 NOTE — Telephone Encounter (Signed)
Tried to contact patient this am Patient not available Message left on voice mail to return our call New RX for synthroid sent to pharmacy on file

## 2015-08-04 NOTE — Telephone Encounter (Signed)
-----   Message from Dessa Phi, MD sent at 08/03/2015  8:36 AM EST ----- TSH is slightly low  Decrease synthroid dose from 200 mcg to 175 mcg q AM

## 2015-08-12 MED FILL — FLUTICASONE PROP 50 MCG SPR: 50 | 30 days supply | Qty: 16 | Fill #1

## 2015-08-13 ENCOUNTER — Other Ambulatory Visit: Payer: Self-pay | Admitting: Family Medicine

## 2015-08-14 MED FILL — LOSARTAN POTASSIUM 100 MG T: 100 | 30 days supply | Qty: 30 | Fill #0

## 2015-08-19 ENCOUNTER — Ambulatory Visit: Payer: Self-pay | Attending: Family Medicine

## 2015-09-01 MED FILL — LEVOTHYROXINE 175 MCG TAB: 175 | 30 days supply | Qty: 30 | Fill #1

## 2015-09-14 MED FILL — LOSARTAN POTASSIUM 100 MG T: 100 | 30 days supply | Qty: 30 | Fill #1

## 2015-09-29 MED FILL — LEVOTHYROXINE 175 MCG TAB: 175 | 30 days supply | Qty: 30 | Fill #2

## 2015-09-29 MED FILL — FLUTICASONE PROP 50 MCG SPR: 50 | 30 days supply | Qty: 16 | Fill #2

## 2015-10-13 ENCOUNTER — Other Ambulatory Visit: Payer: Self-pay | Admitting: Family Medicine

## 2015-10-13 MED FILL — GABAPENTIN 300 MG CAPSULE: 300 | 30 days supply | Qty: 30 | Fill #0

## 2015-10-13 MED FILL — LOSARTAN POTASSIUM 100 MG T: 100 | 30 days supply | Qty: 30 | Fill #2

## 2015-10-13 MED FILL — OMEPRAZOLE DR 20 MG CAPSULE: 20 | 30 days supply | Qty: 60 | Fill #0

## 2015-10-22 ENCOUNTER — Encounter: Payer: Self-pay | Admitting: Family Medicine

## 2015-10-22 ENCOUNTER — Encounter: Payer: Self-pay | Admitting: Clinical

## 2015-10-22 ENCOUNTER — Ambulatory Visit: Payer: Self-pay | Attending: Family Medicine | Admitting: Family Medicine

## 2015-10-22 VITALS — BP 144/86 | HR 71 | Temp 97.9°F | Resp 16 | Ht 72.0 in | Wt 287.0 lb

## 2015-10-22 DIAGNOSIS — R300 Dysuria: Secondary | ICD-10-CM | POA: Insufficient documentation

## 2015-10-22 DIAGNOSIS — G5621 Lesion of ulnar nerve, right upper limb: Secondary | ICD-10-CM | POA: Insufficient documentation

## 2015-10-22 DIAGNOSIS — E118 Type 2 diabetes mellitus with unspecified complications: Secondary | ICD-10-CM | POA: Insufficient documentation

## 2015-10-22 DIAGNOSIS — B353 Tinea pedis: Secondary | ICD-10-CM | POA: Insufficient documentation

## 2015-10-22 DIAGNOSIS — E890 Postprocedural hypothyroidism: Secondary | ICD-10-CM

## 2015-10-22 DIAGNOSIS — Z79899 Other long term (current) drug therapy: Secondary | ICD-10-CM | POA: Insufficient documentation

## 2015-10-22 DIAGNOSIS — F17200 Nicotine dependence, unspecified, uncomplicated: Secondary | ICD-10-CM | POA: Insufficient documentation

## 2015-10-22 DIAGNOSIS — E039 Hypothyroidism, unspecified: Secondary | ICD-10-CM

## 2015-10-22 LAB — POCT URINALYSIS DIPSTICK
BILIRUBIN UA: NEGATIVE
GLUCOSE UA: NEGATIVE
Ketones, UA: NEGATIVE
LEUKOCYTES UA: NEGATIVE
NITRITE UA: NEGATIVE
PH UA: 7
Protein, UA: 100
Spec Grav, UA: 1.02
UROBILINOGEN UA: 1

## 2015-10-22 LAB — GLUCOSE, POCT (MANUAL RESULT ENTRY): POC GLUCOSE: 141 mg/dL — AB (ref 70–99)

## 2015-10-22 LAB — POCT GLYCOSYLATED HEMOGLOBIN (HGB A1C): Hemoglobin A1C: 7

## 2015-10-22 MED ORDER — GABAPENTIN 300 MG PO CAPS: 600.0000 mg | ORAL_CAPSULE | Freq: Every day | ORAL | Status: DC

## 2015-10-22 MED ORDER — TERBINAFINE HCL 1 % EX CREA: 1.0000 "application " | TOPICAL_CREAM | Freq: Two times a day (BID) | CUTANEOUS | Status: DC

## 2015-10-22 MED FILL — GABAPENTIN 300 MG CAPSULE: 300 | 30 days supply | Qty: 60 | Fill #0

## 2015-10-22 NOTE — Assessment & Plan Note (Signed)
Patient dysuria. He has just small blood in his urine and no other signs of UTI. He had repeat STD testing done at department today.  Recommend increase intake of water. Patient follow-up with the results of STI testing today.

## 2015-10-22 NOTE — Assessment & Plan Note (Signed)
Persistent ulnar neuropathy did not improve the gabapentin.  Patient has been restarted on gabapentin with an increased dose 600 nightly after 3 days. Patient has been referred to neurology for nerve conduction studies.

## 2015-10-22 NOTE — Progress Notes (Signed)
[facility] 10/22/2015 07/31/2015 04/22/2015  Decreased Interest 1 0 0  Down, Depressed, Hopeless 1 2 0  PHQ - 2 Score 2 2 0  Altered sleeping 0 2 -  Tired, decreased energy 2 1 -  Change in appetite 1 1 -  Feeling bad or failure about yourself  2 2 -  Trouble concentrating 1 1 -  Moving slowly or fidgety/restless 0 0 -  Suicidal thoughts 0 0 -  PHQ-9 Score 8 9 -    GAD 7 : Generalized Anxiety Score 10/22/2015 07/31/2015  Nervous, Anxious, on Edge 2 0  Control/stop worrying 2 -  Worry too much - different things 2 3  Trouble relaxing 1 0  Restless 0 0  Easily annoyed or irritable 3 2  Afraid - awful might happen 2 2  Total GAD 7 Score 12 -

## 2015-10-22 NOTE — Assessment & Plan Note (Signed)
Patient with diet-controlled type 2 diabetes. Patient also continued low-carb diet and regular exercise.

## 2015-10-22 NOTE — Patient Instructions (Addendum)
Steven [name] was seen today for diabetes.  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 Needs office visit for refills -     Ambulatory referral to Neurology  S/P complete thyroidectomy -     TSH  Tinea pedis of both feet -     terbinafine (LAMISIL AT) 1 % cream; Apply 1 application topically 2 (two) times daily.   F/u in 3 months for diabetes   Dr. Armen PickupFunches

## 2015-10-22 NOTE — Progress Notes (Signed)
Subjective:  Patient ID: Steven [name], male    DOB: [birth year]  Age: 40 y.o. MRN: [id number]  CC: Diabetes   HPI Steven [name] presents for   1. CHRONIC DIABETES  Disease Monitoring  Blood Sugar Ranges: not checking   Polyuria: no   Visual problems: no   Medication Compliance: diet controlled   Medication Side Effects  Hypoglycemia: no   2. Dysuria: for past few days. [facility] testing. He denies nausea, vomiting, flank pain, penis discharge, blood in urine. Denies history of renal stones. He admits to poor by mouth intake of water.  3. Right elbow pain: Patient has persistent elbow pain with numbness fourth and fifth digit. This is chronic pain since his thyroidectomy done in December 2016. Pain sharp and shooting exacerbated by use his right hand.  Social History  Substance Use Topics  . Smoking status: Current Every Day Smoker  . Smokeless tobacco: Not on file  . Alcohol Use: No   Outpatient Prescriptions Prior to Visit  Medication Sig Dispense Refill  . albuterol (PROVENTIL HFA;VENTOLIN HFA) 108 (90 BASE) MCG/ACT inhaler Inhale 2 puffs into the lungs every 6 (six) hours as needed for wheezing or shortness of breath. 1 Inhaler 1  . Blood Glucose Monitoring Suppl (TRUE METRIX METER) w/Device KIT 1 each by Does not apply route as needed. 1 kit 0  . fluticasone (FLONASE) 50 MCG/ACT nasal spray Place 2 sprays into both nostrils daily. 16 g 3  . gabapentin (NEURONTIN) 300 MG capsule Take 1 capsule (300 mg total) by mouth at bedtime. Needs office visit for refills 30 capsule 0  . glucose blood (TRUE METRIX BLOOD GLUCOSE TEST) test strip 1 each by Other route every morning. 100 each 11  . levothyroxine (SYNTHROID) 175 MCG tablet Take 1 tablet (175 mcg total) by mouth daily before breakfast. 30 tablet 2  . losartan (COZAAR) 100 MG tablet TAKE 1 TABLET BY MOUTH DAILY 30 tablet 2  . naproxen (NAPROSYN) 500 MG tablet Take 1 tablet (500 mg  total) by mouth 2 (two) times daily. 30 tablet 0  . omeprazole (PRILOSEC) 20 MG capsule TAKE TWO CAPSULES BY MOUTH DAILY 60 capsule 2  . TRUEPLUS LANCETS 28G MISC 1 each by Does not apply route every morning. 100 each 11  . buPROPion (WELLBUTRIN SR) 150 MG 12 hr tablet Take 1 tablet (150 mg total) by mouth 2 (two) times daily. Once daily in morning for 3 days, then twice daily, take second dose before 5 PM (Patient not taking: Reported on 10/22/2015) 60 tablet 2  . rosuvastatin (CRESTOR) 40 MG tablet Take 40 mg by mouth daily. Reported on 10/22/2015     No facility-administered medications prior to visit.    ROS Review of Systems  Constitutional: Negative for fever, chills, fatigue and unexpected weight change.  Eyes: Negative for visual disturbance.  Respiratory: Negative for cough and shortness of breath.   Cardiovascular: Negative for chest pain, palpitations and leg swelling.  Gastrointestinal: Negative for nausea, vomiting, abdominal pain, diarrhea, constipation and blood in stool.  Endocrine: Negative for polydipsia, polyphagia and polyuria.  Genitourinary: Positive for dysuria.  Musculoskeletal: Negative for myalgias, back pain, arthralgias, gait problem and neck pain.  Skin: Negative for rash.  Allergic/Immunologic: Negative for immunocompromised state.  Hematological: Negative for adenopathy. Does not bruise/bleed easily.  Psychiatric/Behavioral: Negative for suicidal ideas, sleep disturbance and dysphoric mood. The patient is not nervous/anxious.     Objective:  BP 143/87  mmHg  Pulse 71  Temp(Src) 97.9 F (36.6 C) (Oral)  Resp 16  Ht 6' (1.829 m)  Wt 287 lb (130.182 kg)  BMI 38.92 kg/m2  SpO2 96%  BP/Weight 10/22/2015 07/31/2015 37/48/2707  Systolic BP 867 544 920  Diastolic BP 87 73 92  Wt. (Lbs) 287 295 286  BMI 38.92 40 37.74   Physical Exam  Constitutional: He appears well-developed and well-nourished. No distress.  HENT:  Head: Normocephalic and atraumatic.    Neck: Normal range of motion. Neck supple.  Cardiovascular: Normal rate, regular rhythm, normal heart sounds and intact distal pulses.   Pulmonary/Chest: Effort normal and breath sounds normal.  Musculoskeletal: He exhibits no edema.  Neurological: He is alert.  Skin: Skin is warm and dry. No rash noted. No erythema.  Psychiatric: He has a normal mood and affect.   Lab Results  Component Value Date   HGBA1C 7.0 10/22/2015   CBG 141  UA normal except for small blood    Assessment & Plan:  ,Steven [name] was seen today for diabetes.  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 Needs office visit for refills -     Ambulatory referral to Neurology  S/P complete thyroidectomy -     TSH  Tinea pedis of both feet -     terbinafine (LAMISIL AT) 1 % cream; Apply 1 application topically 2 (two) times daily.    No orders of the defined types were placed in this encounter.    Follow-up: No Follow-up on file.   Boykin Nearing MD

## 2015-10-22 NOTE — Progress Notes (Signed)
F/U DM  Not checking glucose at home  Burning with urination  Pain scale # 3 Tobacco user 1/2 ppday  No suicidal thoughts in the past two weeks

## 2015-10-23 LAB — TSH: TSH: 1.21 m[IU]/L (ref 0.40–4.50)

## 2015-10-23 MED ORDER — LEVOTHYROXINE SODIUM 175 MCG PO TABS: 175.0000 ug | ORAL_TABLET | Freq: Every day | ORAL | Status: DC

## 2015-10-23 MED FILL — LEVOTHYROXINE 175 MCG TAB: 175 | 30 days supply | Qty: 30 | Fill #0

## 2015-10-23 NOTE — Addendum Note (Signed)
Addended by: Dessa PhiFUNCHES, Levie Owensby on: 10/23/2015 08:41 AM   Modules accepted: Orders

## 2015-10-26 ENCOUNTER — Telehealth: Payer: Self-pay | Admitting: *Deleted

## 2015-10-26 NOTE — Telephone Encounter (Signed)
-----   Message from Dessa PhiJosalyn Funches, MD sent at 10/23/2015  8:40 AM EDT ----- TSH normal  Continue current synthroid dose

## 2015-10-26 NOTE — Telephone Encounter (Signed)
Date of birth verified by pt  Normal TSH, continue taking medication as prescribed  Pt verbalized understanding

## 2015-11-02 MED FILL — FLUTICASONE PROP 50 MCG SPR: 50 | 30 days supply | Qty: 16 | Fill #3

## 2015-11-09 MED FILL — LOSARTAN POTASSIUM 100 MG T: 100 | 30 days supply | Qty: 30 | Fill #3

## 2015-11-09 MED FILL — ?OMEPRAZOLE DR 20 MG CAPSUL: 20 | 30 days supply | Qty: 60 | Fill #1

## 2015-11-18 ENCOUNTER — Ambulatory Visit (INDEPENDENT_AMBULATORY_CARE_PROVIDER_SITE_OTHER): Payer: Self-pay | Admitting: Neurology

## 2015-11-18 ENCOUNTER — Encounter: Payer: Self-pay | Admitting: Neurology

## 2015-11-18 VITALS — BP 148/100 | HR 64 | Ht 72.0 in | Wt 290.5 lb

## 2015-11-18 DIAGNOSIS — R202 Paresthesia of skin: Secondary | ICD-10-CM

## 2015-11-18 DIAGNOSIS — G5621 Lesion of ulnar nerve, right upper limb: Secondary | ICD-10-CM

## 2015-11-18 DIAGNOSIS — F17200 Nicotine dependence, unspecified, uncomplicated: Secondary | ICD-10-CM

## 2015-11-18 NOTE — Progress Notes (Signed)
[facility] Clinic Note - Initial Visit   Date: 11/18/2015  Steven [name] MRN: [id number] DOB: [birth year]   Dear Dr. Adrian Blackwater:  Thank you for your kind referral of Steven [name] for consultation of right hand paresthesias. Although his history is well known to you, please allow [place] to reiterate it for the purpose of our medical record. The patient was accompanied to the clinic by self.   History of Present Illness: Steven [name] is a 40 y.o. right-handed [place] American male with hypertension, COPD, hyperlipidemia, hypertension, diabetes mellitus, and current smoker presenting for evaluation of right arm pain and paresthesias.    He underwent thyroidectomy for multinodule goiter on December 2016 at [facility].  About a week later, he began noticing sharp pain over the lateral elbow and numbness and tingling of the last two fingers.  Pain is intermittent and worse with lifting or repetitively using the arms.  Resting helps.  He has tried tylenol and ibuprofen which does not help.  He saw his PCP who started him on gabapentin 622m at bedtime which has helped about 50% with the pain, but he continues to have constant tingling and numbness involving the last two fingers.  No associated weakness and he denies dropping objects.  He has no neck pain, except for intermittent pain over his surgical scar.   Out-side paper records, electronic medical record, and images have been reviewed where available and summarized as:  Lab Results  Component Value Date   HGBA1C 7.0 10/22/2015    Past Medical History  Diagnosis Date  . COPD (chronic obstructive pulmonary disease) ([facility]   . Hypertension   . Hypercholesterolemia   . Sleep apnea   . Cigarette smoker   . Diabetes mellitus without complication ([place] [address]   Past Surgical History  Procedure Laterality Date  . Thyroidectomy Bilateral 06/15/2015     Medications:  Outpatient Encounter Prescriptions  as of 11/18/2015  Medication Sig  . albuterol (PROVENTIL HFA;VENTOLIN HFA) 108 (90 BASE) MCG/ACT inhaler Inhale 2 puffs into the lungs every 6 (six) hours as needed for wheezing or shortness of breath.  . Blood Glucose Monitoring Suppl (TRUE METRIX METER) w/Device KIT 1 each by Does not apply route as needed.  . fluticasone (FLONASE) 50 MCG/ACT nasal spray Place 2 sprays into both nostrils daily.  .Marland Kitchengabapentin (NEURONTIN) 300 MG capsule Take 600 mg by mouth at bedtime.  .Marland Kitchenglucose blood (TRUE METRIX BLOOD GLUCOSE TEST) test strip 1 each by Other route every morning.  .Marland Kitchenlevothyroxine (SYNTHROID) 175 MCG tablet Take 1 tablet (175 mcg total) by mouth daily before breakfast.  . losartan (COZAAR) 100 MG tablet TAKE 1 TABLET BY MOUTH DAILY  . omeprazole (PRILOSEC) 20 MG capsule TAKE TWO CAPSULES BY MOUTH DAILY  . terbinafine (LAMISIL AT) 1 % cream Apply 1 application topically 2 (two) times daily.  . TRUEPLUS LANCETS 28G MISC 1 each by Does not apply route every morning.  . [DISCONTINUED] buPROPion (WELLBUTRIN SR) 150 MG 12 hr tablet Take 1 tablet (150 mg total) by mouth 2 (two) times daily. Once daily in morning for 3 days, then twice daily, take second dose before 5 PM (Patient not taking: Reported on 10/22/2015)  . [DISCONTINUED] gabapentin (NEURONTIN) 300 MG capsule Take 2 capsules (600 mg total) by mouth at bedtime. Needs office visit for refills  . [DISCONTINUED] naproxen (NAPROSYN) 500 MG tablet Take 1 tablet (500 mg total) by mouth 2 (two) times daily.  . [DISCONTINUED] rosuvastatin (  CRESTOR) 40 MG tablet Take 40 mg by mouth daily. Reported on 10/22/2015   No facility-administered encounter medications on file as of 11/18/2015.     Allergies:  Allergies  Allergen Reactions  . Penicillins     Family History: Family History  Problem Relation Age of Onset  . Cancer Mother   . Diabetes Father   . Thyroid disease Paternal Uncle     Social History: Social History  Substance Use Topics  .  Smoking status: Current Every Day Smoker -- 1.00 packs/day for 27 years    Types: Cigarettes  . Smokeless tobacco: Never Used  . Alcohol Use: No   Social History   Social History Narrative   Lives with girlfriend in a one story home.  Has 1 child.     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     Education: some college.    Review of Systems:  CONSTITUTIONAL: No fevers, chills, night sweats, or weight loss.   EYES: No visual changes or eye pain ENT: No hearing changes.  No history of nose bleeds.   RESPIRATORY: No cough, wheezing and shortness of breath.   CARDIOVASCULAR: Negative for chest pain, and palpitations.   GI: Negative for abdominal discomfort, blood in stools or black stools.  No recent change in bowel habits.   GU:  No history of incontinence.   MUSCLOSKELETAL: No history of joint pain or swelling.  No myalgias.   SKIN: Negative for lesions, rash, and itching.   HEMATOLOGY/ONCOLOGY: Negative for prolonged bleeding, bruising easily, and swollen nodes.  No history of cancer.   ENDOCRINE: Negative for cold or heat intolerance, polydipsia or goiter.   PSYCH:  No depression or anxiety symptoms.   NEURO: As Above.   Vital Signs:  BP 148/100 mmHg  Pulse 64  Ht 6' (1.829 m)  Wt 290 lb 8 oz (131.77 kg)  BMI 39.39 kg/m2  SpO2 96%   General Medical Exam:   General:  Well appearing, comfortable.   Eyes/ENT: see cranial nerve examination.   Neck: No masses appreciated.  Full range of motion without tenderness.  No carotid bruits. Respiratory:  Clear to auscultation, good air entry bilaterally.   Cardiac:  Regular rate and rhythm, no murmur.   Extremities:  No deformities, edema, or skin discoloration.  Skin:  No rashes or lesions.  Neurological Exam: MENTAL STATUS including orientation to time, place, person, recent and remote memory, attention span and concentration, language, and fund of knowledge is normal.  Speech is not dysarthric.  CRANIAL NERVES: II:  No  visual field defects.  Unremarkable fundi.   III-IV-VI: Pupils equal round and reactive to light.  Normal conjugate, extra-ocular eye movements in all directions of gaze.  No nystagmus.  No ptosis.   V:  Normal facial sensation.    VII:  Normal facial symmetry and movements.  VIII:  Normal hearing and vestibular function.   IX-X:  Normal palatal movement.   XI:  Normal shoulder shrug and head rotation.   XII:  Normal tongue strength and range of motion, no deviation or fasciculation.  MOTOR:  No atrophy, fasciculations or abnormal movements.  No pronator drift.  Tone is normal.    Right Upper Extremity:    Left Upper Extremity:    Deltoid  5/5   Deltoid  5/5   Biceps  5/5   Biceps  5/5   Triceps  5/5   Triceps  5/5   Wrist extensors  5/5   Wrist extensors  5/5   Wrist flexors  5/5   Wrist flexors  5/5   Finger extensors  5/5   Finger extensors  5/5   Finger flexors  5/5   Finger flexors  5/5   Dorsal interossei  5/5   Dorsal interossei  5/5   Abductor pollicis  5/5   Abductor pollicis  5/5   Tone (Ashworth scale)  0  Tone (Ashworth scale)  0   Right Lower Extremity:    Left Lower Extremity:    Hip flexors  5/5   Hip flexors  5/5   Hip extensors  5/5   Hip extensors  5/5   Knee flexors  5/5   Knee flexors  5/5   Knee extensors  5/5   Knee extensors  5/5   Dorsiflexors  5/5   Dorsiflexors  5/5   Plantarflexors  5/5   Plantarflexors  5/5   Toe extensors  5/5   Toe extensors  5/5   Toe flexors  5/5   Toe flexors  5/5   Tone (Ashworth scale)  0  Tone (Ashworth scale)  0   MSRs:  Right                                                                 Left brachioradialis 2+  brachioradialis 2+  biceps 2+  biceps 2+  triceps 2+  triceps 2+  patellar 2+  patellar 2+  ankle jerk 2+  ankle jerk 2+  Hoffman no  Hoffman no  plantar response down  plantar response down   SENSORY: Reduced pin prick and temperature over the palmer surface of the last 4th and 5th fingers only.  Sensation  elsewhere intact.  COORDINATION/GAIT: Normal finger-to- nose-finger and heel-to-shin.  Intact rapid alternating movements bilaterally.  Able to rise from a chair without using arms.  Gait narrow based and stable. Tandem and stressed gait intact.    IMPRESSION: 1.  Right hand paresthesias involving the 4th and 5th digits  - Exam shows sensory loss over the 4th and 5th digits only, with normal motor strength and reflexes.  Ulnar neuropathy is suspected, but this would not give you lateral elbow pain.  NCS/EMG will be ordered of the RUE to better localize symptoms  - Continue gabapentin 651m at bedtime which is providing adequate relief of pain.  He did not wish to uptitrate this  - Advised to avoid hyperflexion at the elbow  2.  Tobacco user  - Smoking cessation instruction/counseling given:  counseled patient on the dangers of tobacco use, advised patient to stop smoking, and reviewed strategies to maximize success  Further recommendations will be based on the results of his EMG   The duration of this appointment visit was 25 minutes of face-to-face time with the patient.  Greater than 50% of this time was spent in counseling, explanation of diagnosis, planning of further management, and coordination of care.   Thank you for allowing me to participate in patient's care.  If I can answer any additional questions, I would be pleased to do so.    Sincerely,    Demaree Liberto K. PPosey Pronto DO

## 2015-11-18 NOTE — Patient Instructions (Signed)
NCS/EMG of the right upper extremity.  We will call you with the results and determine the next step. Continue gabapentin 600mg  at bedtime Encouraged to quit smoking

## 2015-11-30 ENCOUNTER — Other Ambulatory Visit: Payer: Self-pay | Admitting: Family Medicine

## 2015-11-30 MED FILL — FLUTICASONE PROP 50 MCG SPR: 50 | 30 days supply | Qty: 16 | Fill #0

## 2015-11-30 MED FILL — LOSARTAN POTASSIUM 100 MG T: 100 | 30 days supply | Qty: 30 | Fill #0

## 2015-12-01 ENCOUNTER — Ambulatory Visit (INDEPENDENT_AMBULATORY_CARE_PROVIDER_SITE_OTHER): Payer: Self-pay | Admitting: Neurology

## 2015-12-01 ENCOUNTER — Telehealth: Payer: Self-pay | Admitting: Neurology

## 2015-12-01 DIAGNOSIS — G5601 Carpal tunnel syndrome, right upper limb: Secondary | ICD-10-CM

## 2015-12-01 DIAGNOSIS — R202 Paresthesia of skin: Secondary | ICD-10-CM

## 2015-12-01 DIAGNOSIS — G5621 Lesion of ulnar nerve, right upper limb: Secondary | ICD-10-CM

## 2015-12-01 NOTE — Procedures (Signed)
[facility]  [address], [address]  [place], [place] [address] Tel: [phone] Fax:  [phone] Test Date:  12/01/2015  Patient: Steven KetoShawn [name] DOB: May 04, 1976 Physician: Nita Sickleonika Patel, DO  Sex: Male Height: 6' " Ref Phys: Nita Sickleonika Patel, DO  ID#: [id number] Temp: 32.1C Technician: Judie PetitM. Dean   Patient Complaints: This 40 year old gentleman referred for evaluation of numbness in 4th and 5th digits of right hand.  NCV & EMG Findings: Extensive electrodiagnostic testing of the right upper extremity shows: 1. Right median sensory and the right ulnar sensory nerves showed prolonged distal peak latency (R4.0, R3.4 ms) and normal amplitude. Right dorsal ulnar cutaneous sensory response is within normal limits. 2. Right median motor response shows prolonged latency (4.1 ms).  The right ulnar motor nerve showed decreased conduction velocity (A Elbow-B Elbow, 43 m/s). 3. There is no evidence of active or chronic motor axon loss changes affecting any of the tested muscles. Motor unit configuration and recruitment pattern is within normal limits.  Impression: 1. Right ulnar neuropathy with slowing across the elbow, purely demyelinating in type. 2. Right median neuropathy at or distal to the wrist, consistent with the clinical diagnosis of carpal tunnel syndrome. Overall, these findings are mild in degree electrically. 3. There is no evidence of a cervical radiculopathy.   ___________________________ Nita Sickleonika Patel, DO    Nerve Conduction Studies Anti Sensory Summary Table   Stim Site NR Peak (ms) Norm Peak (ms) P-T Amp (V) Norm P-T Amp  Right DorsCutan Anti Sensory (Dorsum 5th MC)  Wrist    2.3 <3.1 12.6 >12  Right Median Anti Sensory (2nd Digit)  Wrist    4.0 <3.4 24.5 >20  Right Ulnar Anti Sensory (5th Digit)  Wrist    3.4 <3.1 13.1 >12   Motor Summary Table   Stim Site NR Onset (ms) Norm Onset (ms) O-P Amp (mV) Norm O-P Amp Site1 Site2 Delta-0 (ms) Dist (cm) Vel (m/s)  Norm Vel (m/s)  Right Median Motor (Abd Poll Brev)  Wrist    4.1 <3.9 12.8 >6 Elbow Wrist 4.7 24.0 51 >50  Elbow    8.8  12.0         Right Ulnar Motor (Abd Dig Minimi)  Wrist    2.7 <3.1 12.3 >7 B Elbow Wrist 4.6 25.0 54 >50  B Elbow    7.3  12.2  A Elbow B Elbow 2.3 10.0 43 >50  A Elbow    9.6  11.9          EMG   Side Muscle Ins Act Fibs Psw Fasc Number Recrt Dur Dur. Amp Amp. Poly Poly. Comment  Right 1stDorInt Nml Nml Nml Nml Nml Nml Nml Nml Nml Nml Nml Nml N/A  Right ABD Dig Min Nml Nml Nml Nml Nml Nml Nml Nml Nml Nml Nml Nml N/A  Right Ext Indicis Nml Nml Nml Nml Nml Nml Nml Nml Nml Nml Nml Nml N/A  Right FlexCarpiUln Nml Nml Nml Nml Nml Nml Nml Nml Nml Nml Nml Nml N/A  Right PronatorTeres Nml Nml Nml Nml Nml Nml Nml Nml Nml Nml Nml Nml N/A  Right Biceps Nml Nml Nml Nml Nml Nml Nml Nml Nml Nml Nml Nml N/A  Right Triceps Nml Nml Nml Nml Nml Nml Nml Nml Nml Nml Nml Nml N/A  Right Deltoid Nml Nml Nml Nml Nml Nml Nml Nml Nml Nml Nml Nml N/A      Waveforms:

## 2015-12-01 NOTE — Telephone Encounter (Signed)
Results of EMG discussed with patient which shows right ulnar neuropathy and mild carpal tunnel syndrome. He is symptomatic from ulnar neuropathy and I encouraged him to start using a soft elbow pad and avoid overflexing at the elbow as to minimize compression and stretching of the nerve.  Steven [name] K. Allena KatzPatel, DO

## 2015-12-07 ENCOUNTER — Other Ambulatory Visit: Payer: Self-pay | Admitting: Family Medicine

## 2015-12-07 NOTE — Telephone Encounter (Signed)
Patient came into office requesting medication refill on levothyroxine (SYNTHROID) 175 MCG tablet, patient states he is completely out of medication please f/up

## 2015-12-08 ENCOUNTER — Other Ambulatory Visit: Payer: Self-pay | Admitting: Family Medicine

## 2015-12-08 MED FILL — LEVOTHYROXINE 175 MCG TAB: 175 | 30 days supply | Qty: 30 | Fill #0

## 2015-12-24 MED FILL — LOSARTAN POTASSIUM 100 MG T: 100 | 30 days supply | Qty: 30 | Fill #1

## 2016-01-04 ENCOUNTER — Ambulatory Visit: Payer: Self-pay | Attending: Family Medicine | Admitting: Family Medicine

## 2016-01-04 ENCOUNTER — Encounter: Payer: Self-pay | Admitting: Family Medicine

## 2016-01-04 VITALS — BP 139/79 | HR 87 | Temp 98.4°F | Resp 16 | Ht 72.0 in | Wt 288.0 lb

## 2016-01-04 DIAGNOSIS — E890 Postprocedural hypothyroidism: Secondary | ICD-10-CM | POA: Insufficient documentation

## 2016-01-04 DIAGNOSIS — J069 Acute upper respiratory infection, unspecified: Secondary | ICD-10-CM

## 2016-01-04 DIAGNOSIS — J111 Influenza due to unidentified influenza virus with other respiratory manifestations: Secondary | ICD-10-CM | POA: Insufficient documentation

## 2016-01-04 DIAGNOSIS — Z79899 Other long term (current) drug therapy: Secondary | ICD-10-CM | POA: Insufficient documentation

## 2016-01-04 DIAGNOSIS — E118 Type 2 diabetes mellitus with unspecified complications: Secondary | ICD-10-CM | POA: Insufficient documentation

## 2016-01-04 DIAGNOSIS — R0602 Shortness of breath: Secondary | ICD-10-CM | POA: Insufficient documentation

## 2016-01-04 DIAGNOSIS — N509 Disorder of male genital organs, unspecified: Secondary | ICD-10-CM | POA: Insufficient documentation

## 2016-01-04 LAB — GLUCOSE, POCT (MANUAL RESULT ENTRY): POC GLUCOSE: 154 mg/dL — AB (ref 70–99)

## 2016-01-04 MED ORDER — AMOXICILLIN 875 MG PO TABS: 875.0000 mg | ORAL_TABLET | Freq: Two times a day (BID) | ORAL | Status: DC

## 2016-01-04 MED ORDER — LEVOTHYROXINE SODIUM 175 MCG PO TABS: 175.0000 ug | ORAL_TABLET | Freq: Every day | ORAL | Status: AC

## 2016-01-04 MED ORDER — AMOXICILLIN 500 MG PO TABS: 500.0000 mg | ORAL_TABLET | Freq: Three times a day (TID) | ORAL | Status: DC

## 2016-01-04 MED FILL — AMOXICILLIN 500 MG CAPSULE: 500 | 10 days supply | Qty: 30 | Fill #0

## 2016-01-04 MED FILL — ?LEVOTHYROXINE 175 MCG TAB: 175 | 30 days supply | Qty: 30 | Fill #0

## 2016-01-04 NOTE — Progress Notes (Signed)
C/C SHOB  Tobacco user 1/2 ppday  No pain today  No suicidal thoughts in the past two weeks

## 2016-01-04 NOTE — Patient Instructions (Addendum)
Steven [name] was seen today for shortness of breath.  Diagnoses and all orders for this visit:  Type 2 diabetes mellitus with complication, without long-term current use of insulin (HCC) -     POCT glucose (manual entry)  S/P complete thyroidectomy -     levothyroxine (SYNTHROID, LEVOTHROID) 175 MCG tablet; Take 1 tablet (175 mcg total) by mouth daily before breakfast.  Post-surgical hypothyroidism -     levothyroxine (SYNTHROID, LEVOTHROID) 175 MCG tablet; Take 1 tablet (175 mcg total) by mouth daily before breakfast.  Acute upper respiratory infection -     amoxicillin (AMOXIL) 875 MG tablet; Take 1 tablet (875 mg total) by mouth 2 (two) times daily.  Papule   Call if you develop any problem with amoxicillin   We will monitor painless papule on penis for any changes  Here is the # for [facility]  Kim, Hulan SaasLawrence Thomas, MD  [address]  Surgery  CB# [id number] Physicians Office Building  [place], [place] [id number]  [phone](Phone)  [phone] (Fax)   Smoking cessation support: smoking cessation hotline: 1-800-QUIT-NOW.  Smoking cessation classes are available through [facility]. Call [phone] or visit our website at [email or web address].  F.u in 2 months for diabetes   Dr. Armen PickupFunches

## 2016-01-04 NOTE — Addendum Note (Signed)
Addended by: Dessa PhiFUNCHES, Phynix Horton on: 01/04/2016 12:47 PM   Modules accepted: Orders

## 2016-01-04 NOTE — Assessment & Plan Note (Signed)
Upper respiratory illness Patient has PCN allergy noted, but patient denies allergy to PCN stating he never had rash, swelling, SOB or hives   Amoxicillin course Continue flonase Continue albuterol prn

## 2016-01-04 NOTE — Assessment & Plan Note (Signed)
Papule on posterior scrotum Was possibly a cyst Area is small There are no skin changes  Plan: Reassurance Watchful waiting

## 2016-01-04 NOTE — Progress Notes (Signed)
Subjective:  Patient ID: Steven [name], male    DOB: December 23, 1975  Age: 40 y.o. MRN: [id number]  CC: Shortness of Breath   HPI Steven [name] has DM2, smoker, OSA, post thyroidectomy hypothyroidism he  presents for    1. Shortness of breath: for past 2 weeks with past pain and cough. Non productive. SOB with exertion. He has congestion. He continues to smoke 1/2 PPD. He is using albuterol once daily which provides a few hours of relief and flonase. No fever or chills. No known sick contacts.   2. Papule: papule in posterior scrotum x 2 months. Non tender. Not enlarging. He expressed it about 6-8 weeks ago. Now nothing drains. No fever, chills, weight loss, discharge from penis.   Social History  Substance Use Topics  . Smoking status: Current Every Day Smoker -- 1.00 packs/day for 27 years    Types: Cigarettes  . Smokeless tobacco: Never Used  . Alcohol Use: No    Outpatient Prescriptions Prior to Visit  Medication Sig Dispense Refill  . albuterol (PROVENTIL HFA;VENTOLIN HFA) 108 (90 BASE) MCG/ACT inhaler Inhale 2 puffs into the lungs every 6 (six) hours as needed for wheezing or shortness of breath. 1 Inhaler 1  . Blood Glucose Monitoring Suppl (TRUE METRIX METER) w/Device KIT 1 each by Does not apply route as needed. 1 kit 0  . fluticasone (FLONASE) 50 MCG/ACT nasal spray PLACE 2 SPRAYS INTO BOTH NOSTRILS DAILY. 16 g 3  . gabapentin (NEURONTIN) 300 MG capsule Take 600 mg by mouth at bedtime.    Marland Kitchen glucose blood (TRUE METRIX BLOOD GLUCOSE TEST) test strip 1 each by Other route every morning. 100 each 11  . levothyroxine (SYNTHROID, LEVOTHROID) 175 MCG tablet TAKE 1 TABLET BY MOUTH DAILY BEFORE BREAKFAST. 30 tablet 2  . losartan (COZAAR) 100 MG tablet TAKE 1 TABLET BY MOUTH DAILY 30 tablet 3  . omeprazole (PRILOSEC) 20 MG capsule TAKE TWO CAPSULES BY MOUTH DAILY 60 capsule 2  . TRUEPLUS LANCETS 28G MISC 1 each by Does not apply route every morning. 100 each 11  . terbinafine  (LAMISIL AT) 1 % cream Apply 1 application topically 2 (two) times daily. (Patient not taking: Reported on 01/04/2016) 30 g 0   No facility-administered medications prior to visit.    ROS Review of Systems  Constitutional: Negative for fever, chills, fatigue and unexpected weight change.  HENT: Positive for congestion.   Eyes: Negative for visual disturbance.  Respiratory: Positive for cough and shortness of breath.   Cardiovascular: Positive for chest pain. Negative for palpitations and leg swelling.  Gastrointestinal: Negative for nausea, vomiting, abdominal pain, diarrhea, constipation and blood in stool.  Endocrine: Negative for polydipsia, polyphagia and polyuria.  Musculoskeletal: Negative for myalgias, back pain, arthralgias, gait problem and neck pain.  Skin: Negative for rash.  Allergic/Immunologic: Negative for immunocompromised state.  Hematological: Negative for adenopathy. Does not bruise/bleed easily.  Psychiatric/Behavioral: Negative for suicidal ideas, sleep disturbance and dysphoric mood. The patient is not nervous/anxious.     Objective:  BP 139/79 mmHg  Pulse 87  Temp(Src) 98.4 F (36.9 C) (Oral)  Resp 16  Ht 6' (1.829 m)  Wt 288 lb (130.636 kg)  BMI 39.05 kg/m2  SpO2 96%  BP/Weight 01/04/2016 12/18/9474 11/18/6501  Systolic BP 546 568 127  Diastolic BP 79 517 86  Wt. (Lbs) 288 290.5 287  BMI 39.05 39.39 38.92   Physical Exam  Constitutional: He appears well-developed and well-nourished. No distress.  HENT:  Head:  Normocephalic and atraumatic.  Right Ear: Hearing, tympanic membrane, external ear and ear canal normal.  Left Ear: Hearing, tympanic membrane, external ear and ear canal normal.  Nose: Mucosal edema present.  Mouth/Throat: Oropharynx is clear and moist and mucous membranes are normal.  Neck: Normal range of motion. Neck supple.  Cardiovascular: Normal rate, regular rhythm, normal heart sounds and intact distal pulses.   Pulmonary/Chest: Effort  normal and breath sounds normal.  Genitourinary: Testes normal.    Right testis shows no mass, no swelling and no tenderness. Right testis is descended. Cremasteric reflex is not absent on the right side. Left testis shows no mass, no swelling and no tenderness. Left testis is descended. Cremasteric reflex is not absent on the left side.  Musculoskeletal: He exhibits no edema.  Lymphadenopathy:    He has no cervical adenopathy.  Neurological: He is alert.  Skin: Skin is warm and dry. No rash noted. No erythema.  Psychiatric: He has a normal mood and affect.   Lab Results  Component Value Date   HGBA1C 7.0 10/22/2015   CBG 154  Assessment & Plan:   Steven [name] was seen today for shortness of breath.  Diagnoses and all orders for this visit:  Type 2 diabetes mellitus with complication, without long-term current use of insulin (HCC) -     POCT glucose (manual entry)  S/P complete thyroidectomy -     levothyroxine (SYNTHROID, LEVOTHROID) 175 MCG tablet; Take 1 tablet (175 mcg total) by mouth daily before breakfast.  Post-surgical hypothyroidism -     levothyroxine (SYNTHROID, LEVOTHROID) 175 MCG tablet; Take 1 tablet (175 mcg total) by mouth daily before breakfast.  Acute upper respiratory infection -     amoxicillin (AMOXIL) 875 MG tablet; Take 1 tablet (875 mg total) by mouth 2 (two) times daily.  Scrotal disorder    No orders of the defined types were placed in this encounter.    Follow-up: No Follow-up on file.   Boykin Nearing MD

## 2016-01-08 MED FILL — ?OMEPRAZOLE DR 20 MG CAPSUL: 20 | 30 days supply | Qty: 60 | Fill #2

## 2016-01-25 MED FILL — FLUTICASONE PROP 50 MCG SPR: 50 | 30 days supply | Qty: 16 | Fill #1

## 2016-01-25 MED FILL — LOSARTAN POTASSIUM 100 MG T: 100 | 30 days supply | Qty: 30 | Fill #2

## 2016-02-01 MED FILL — ?LEVOTHYROXINE 175 MCG TAB: 175 | 30 days supply | Qty: 30 | Fill #1

## 2016-03-01 ENCOUNTER — Telehealth: Payer: Self-pay | Admitting: Family Medicine

## 2016-03-01 DIAGNOSIS — J069 Acute upper respiratory infection, unspecified: Secondary | ICD-10-CM

## 2016-03-01 NOTE — Telephone Encounter (Signed)
Pt called states he is still having same symptoms from uri and would like to know if he can get more abx. Please f/up

## 2016-03-02 MED FILL — FLUTICASONE PROP 50 MCG SPR: 50 | 30 days supply | Qty: 16 | Fill #2

## 2016-03-02 MED FILL — GABAPENTIN 300 MG CAPSULE: 300 | 30 days supply | Qty: 60 | Fill #1

## 2016-03-02 MED FILL — ?LOSARTAN POTASSIUM 100 MG: 100 | 30 days supply | Qty: 30 | Fill #3

## 2016-03-02 MED FILL — TERBINAFINE HCL 250 MG TAB: 250 | 30 days supply | Qty: 30 | Fill #1

## 2016-03-02 MED FILL — ?LEVOTHYROXINE 175 MCG TAB: 175 | 30 days supply | Qty: 30 | Fill #2

## 2016-03-03 MED ORDER — AMOXICILLIN-POT CLAVULANATE 875-125 MG PO TABS: 1.0000 | ORAL_TABLET | Freq: Two times a day (BID) | ORAL | 0 refills | Status: AC

## 2016-03-03 MED FILL — AMOX-CLAV 875-125 MG TABLET: 875-125 | 10 days supply | Qty: 20 | Fill #0

## 2016-03-03 NOTE — Telephone Encounter (Signed)
Called patient Verified name and DOB   Yes, since symptoms URI have persisted I sent in Augmentin which is a bit stronger  Patient will come pick it up next month and schedule f/u OV

## 2016-03-10 ENCOUNTER — Ambulatory Visit: Payer: Self-pay | Admitting: Neurology

## 2016-03-17 MED FILL — OMEPRAZOLE DR 20 MG CAPSULE: 20 | 30 days supply | Qty: 60 | Fill #3

## 2016-03-29 MED FILL — ?LEVOTHYROXINE 175 MCG TAB: 175 | 30 days supply | Qty: 30 | Fill #3

## 2016-03-29 MED FILL — FLUTICASONE PROP 50 MCG SPR: 50 | 30 days supply | Qty: 16 | Fill #3

## 2016-04-07 NOTE — Progress Notes (Deleted)
Follow-up Visit   Date: 04/07/16    Steven [name] MRN: [id number] DOB: 06-11-1976   Interim History: Steven [name] is a 40 y.o. 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  The patient was accompanied to the clinic by *** who also provides collateral information.    History of present illness: He underwent thyroidectomy for multinodule goiter on December 2016 at [facility].  About a week later, he began noticing sharp pain over the lateral elbow and numbness and tingling of the last two fingers.  Pain is intermittent and worse with lifting or repetitively using the arms.  Resting helps.  He has tried tylenol and ibuprofen which does not help.  He saw his PCP who started him on gabapentin 646m at bedtime which has helped about 50% with the pain, but he continues to have constant tingling and numbness involving the last two fingers.  No associated weakness and he denies dropping objects.  He has no neck pain, except for intermittent pain over his surgical scar.   UPDATE ***:  His NCS/EMG showed right CTS (mild) and ulnar neuropathy at the elbow ***  Medications:  Current Outpatient Prescriptions on File Prior to Visit  Medication Sig Dispense Refill  . albuterol (PROVENTIL HFA;VENTOLIN HFA) 108 (90 BASE) MCG/ACT inhaler Inhale 2 puffs into the lungs every 6 (six) hours as needed for wheezing or shortness of breath. 1 Inhaler 1  . amoxicillin-clavulanate (AUGMENTIN) 875-125 MG tablet Take 1 tablet by mouth 2 (two) times daily. 20 tablet 0  . Blood Glucose Monitoring Suppl (TRUE METRIX METER) w/Device KIT 1 each by Does not apply route as needed. 1 kit 0  . fluticasone (FLONASE) 50 MCG/ACT nasal spray PLACE 2 SPRAYS INTO BOTH NOSTRILS DAILY. 16 g 3  . gabapentin (NEURONTIN) 300 MG capsule Take 600 mg by mouth at bedtime.      .Marland Kitchenglucose blood (TRUE METRIX BLOOD GLUCOSE TEST) test strip 1 each by Other route every morning. 100 each 11  . levothyroxine (SYNTHROID, LEVOTHROID) 175 MCG tablet Take 1 tablet (175 mcg total) by mouth daily before breakfast. 90 tablet 3  . losartan (COZAAR) 100 MG tablet TAKE 1 TABLET BY MOUTH DAILY 30 tablet 3  . omeprazole (PRILOSEC) 20 MG capsule TAKE TWO CAPSULES BY MOUTH DAILY 60 capsule 2  . TRUEPLUS LANCETS 28G MISC 1 each by Does not apply route every morning. 100 each 11   No current facility-administered medications on file prior to visit.     Allergies: No Known Allergies  Review of Systems:  CONSTITUTIONAL: No fevers, chills, night sweats, or weight loss.  EYES: No visual changes or eye pain ENT: No hearing changes.  No history of nose bleeds.   RESPIRATORY: No cough, wheezing and shortness of breath.   CARDIOVASCULAR: Negative for chest pain, and palpitations.   GI: Negative for abdominal discomfort, blood in stools or black stools.  No recent change in bowel habits.   GU:  No history of incontinence.   MUSCLOSKELETAL: No history of joint pain or swelling.  No myalgias.   SKIN: Negative for lesions, rash, and itching.   ENDOCRINE: Negative for cold or heat intolerance, polydipsia or goiter.   PSYCH:  *** depression or anxiety symptoms.   NEURO: As Above.   Vital Signs:  There were no vitals taken for this visit. ***  Neurological Exam: MENTAL STATUS including  orientation to time, place, person, recent and remote memory, attention span and concentration, language, and fund of knowledge is normal.  Speech is not dysarthric.  CRANIAL NERVES:  Pupils equal round and reactive to light.  Normal conjugate, extra-ocular eye movements in all directions of gaze.  Face is symmetric.   MOTOR:  Motor strength is 5/5 in all extremities, except ***.  No atrophy, fasciculations or abnormal movements.  No pronator drift.  Tone is normal.    MSRs:  Reflexes are 2+/4 throughout,  except ***.  SENSORY:  Intact to ***.  COORDINATION/GAIT:   Gait narrow based and stable.   Data: NCS/EMG of the right upper extremity 12/01/2015: 1. Right ulnar neuropathy with slowing across the elbow, purely demyelinating in type. 2. Right median neuropathy at or distal to the wrist, consistent with the clinical diagnosis of carpal tunnel syndrome. Overall, these findings are mild in degree electrically. 3. There is no evidence of a cervical radiculopathy.  IMPRESSION/PLAN: 1.  Right hand paresthesias due to ulnar neuropathy at the elbow confirmed by NCS              - Exam shows sensory loss over the 4th and 5th digits only, with normal motor strength and reflexes.                - Continue gabapentin 682m at bedtime which is providing adequate relief of pain.  He did not wish to uptitrate this              - Advised to avoid hyperflexion at the elbow   2.  Mild carpal tunnel syndrome  - Avoid hyperflexion at the wrist  - Wrist splint if symptoms become bothersome  3.  Tobacco user              - Smoking cessation instruction/counseling given:  counseled patient on the dangers of tobacco use, advised patient to stop smoking, and reviewed strategies to maximize success  ***   The duration of this appointment visit was *** minutes of face-to-face time with the patient.  Greater than 50% of this time was spent in counseling, explanation of diagnosis, planning of further management, and coordination of care.   Thank you for allowing me to participate in patient's care.  If I can answer any additional questions, I would be pleased to do so.    Sincerely,    Donika K. PPosey Pronto DO

## 2016-04-08 ENCOUNTER — Ambulatory Visit: Payer: Self-pay | Admitting: Neurology

## 2016-04-08 DIAGNOSIS — Z029 Encounter for administrative examinations, unspecified: Secondary | ICD-10-CM

## 2016-04-11 MED FILL — ?LOSARTAN POTASSIUM 100 MG: 100 | 30 days supply | Qty: 30 | Fill #4

## 2016-04-28 MED FILL — ?LEVOTHYROXINE 175 MCG TAB: 175 | 30 days supply | Qty: 30 | Fill #4

## 2016-04-29 ENCOUNTER — Other Ambulatory Visit: Payer: Self-pay | Admitting: Family Medicine

## 2016-04-29 MED FILL — ?OMEPRAZOLE DR 20 MG CAPSUL: 20 | 30 days supply | Qty: 60 | Fill #0

## 2016-05-18 ENCOUNTER — Ambulatory Visit: Payer: Self-pay | Admitting: Critical Care Medicine

## 2016-05-27 ENCOUNTER — Other Ambulatory Visit: Payer: Self-pay | Admitting: Family Medicine

## 2016-05-30 ENCOUNTER — Other Ambulatory Visit: Payer: Self-pay | Admitting: Pharmacist

## 2016-05-30 MED ORDER — LOSARTAN POTASSIUM 100 MG PO TABS: 100.0000 mg | ORAL_TABLET | Freq: Every day | ORAL | 0 refills | Status: AC

## 2016-06-07 ENCOUNTER — Telehealth: Payer: Self-pay | Admitting: Family Medicine

## 2016-06-07 MED ORDER — OMEPRAZOLE 20 MG PO CPDR: 40.0000 mg | DELAYED_RELEASE_CAPSULE | Freq: Every day | ORAL | 3 refills | Status: AC

## 2016-06-07 NOTE — Telephone Encounter (Signed)
Omeprazole sent to requested pharmacy  

## 2016-06-07 NOTE — Telephone Encounter (Signed)
Spoke with patient about picking up his prescriptionomeprazole (PRILOSEC) 20 MG capsule   and [facility] [address] - [place], [place] - [address].  Thank you.

## 2016-06-15 ENCOUNTER — Telehealth: Payer: Self-pay | Admitting: Family Medicine

## 2016-06-15 NOTE — Telephone Encounter (Signed)
Patient called the office to speak with PCP regarding diabetes medication. Pt has question and concerns regarding it. It's urgent. Please follow up.  Thank you.

## 2016-06-16 NOTE — Telephone Encounter (Signed)
Called patient Verified name and DOB His A1c is 7.2 He was started on metformin. Did not tolerate it. He has since been transitioned to janumet.  He has moved and established with new PCP.

## 2016-06-16 NOTE — Telephone Encounter (Signed)
Pt states that you told him to stop taking his Diabetes medication, and pt went to a clinic in his area and was told that he needs to be back on medication. Please follow up with pt.

## 2016-07-29 ENCOUNTER — Ambulatory Visit: Payer: Self-pay | Admitting: Neurology

## 2016-08-16 IMAGING — DX DG FOOT COMPLETE 3+V*R*
3 series · 3 of 3 positions shown · non-contrast
Comparison: None.

CLINICAL DATA: 39-year-old male with right foot pain.

EXAM:
RIGHT FOOT COMPLETE - 3+ VIEW

[foot ap]
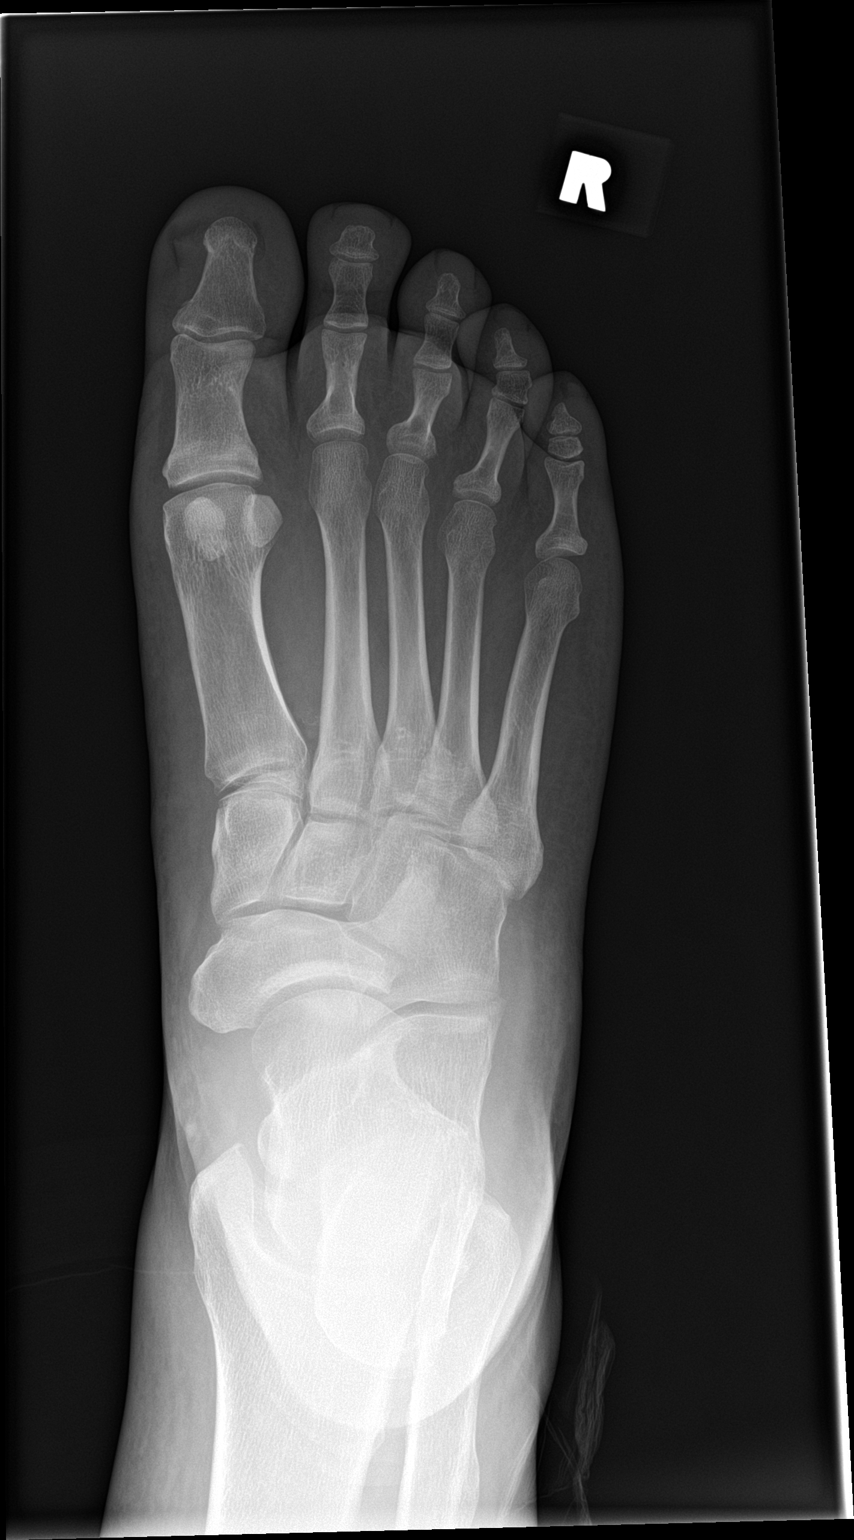

[foot obl]
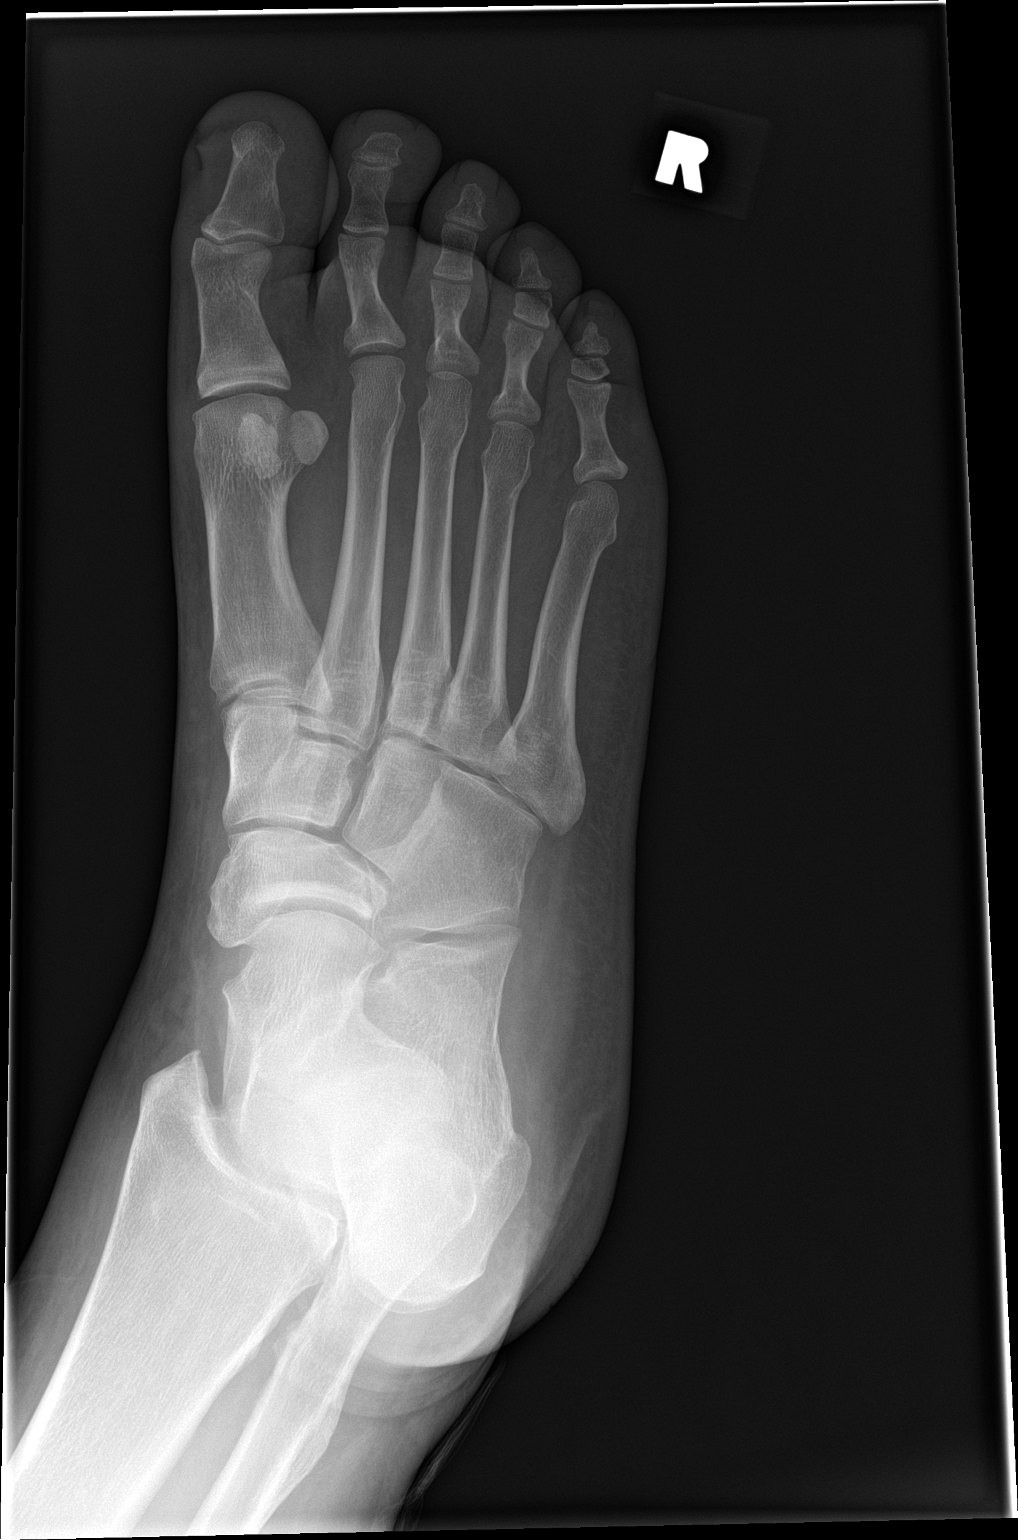

[foot lat]
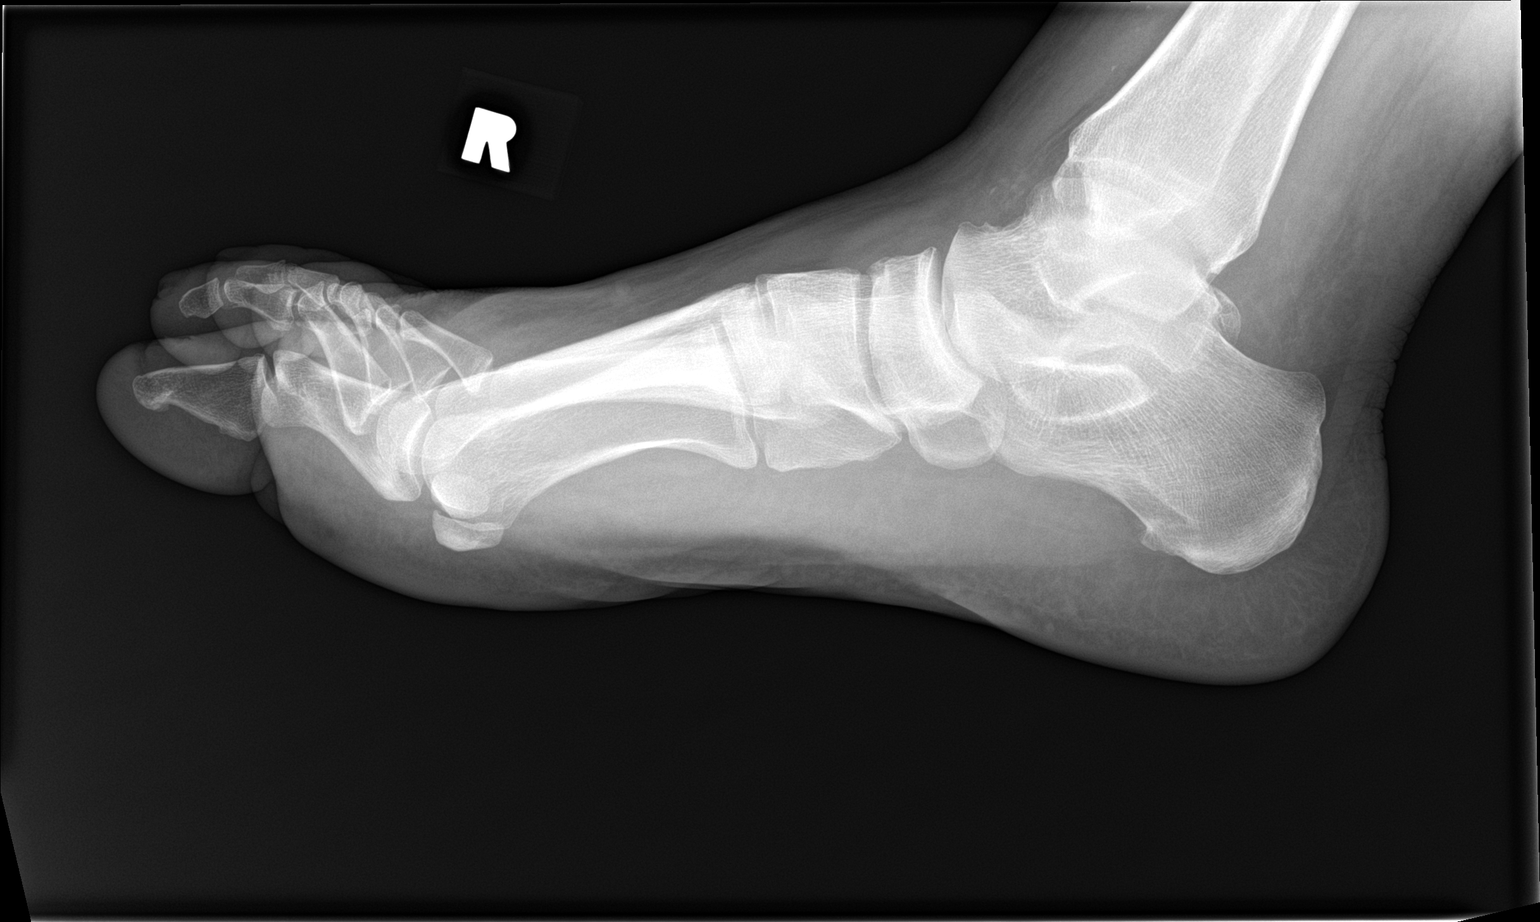

[3 of 3 positions shown; findings below may reference images not displayed]

FINDINGS: There is no evidence of fracture or dislocation. There is no
evidence of arthropathy or other focal bone abnormality. Soft
tissues are unremarkable.
IMPRESSION: Negative.
